# Patient Record
Sex: Female | Born: 1945 | Race: White | Hispanic: No | Smoking: Former smoker
Health system: Southern US, Community
[De-identification: ages and names within clinical notes are randomized; demographics above are authoritative.]

## PROBLEM LIST (undated history)

## (undated) DIAGNOSIS — I2510 Atherosclerotic heart disease of native coronary artery without angina pectoris: Secondary | ICD-10-CM

## (undated) DIAGNOSIS — I2699 Other pulmonary embolism without acute cor pulmonale: Secondary | ICD-10-CM

## (undated) DIAGNOSIS — G43909 Migraine, unspecified, not intractable, without status migrainosus: Secondary | ICD-10-CM

## (undated) DIAGNOSIS — G629 Polyneuropathy, unspecified: Secondary | ICD-10-CM

## (undated) DIAGNOSIS — E785 Hyperlipidemia, unspecified: Secondary | ICD-10-CM

## (undated) DIAGNOSIS — I10 Essential (primary) hypertension: Secondary | ICD-10-CM

## (undated) DIAGNOSIS — H269 Unspecified cataract: Secondary | ICD-10-CM

## (undated) DIAGNOSIS — M069 Rheumatoid arthritis, unspecified: Secondary | ICD-10-CM

## (undated) DIAGNOSIS — K659 Peritonitis, unspecified: Secondary | ICD-10-CM

## (undated) HISTORY — DX: Rheumatoid arthritis, unspecified: M06.9

## (undated) HISTORY — PX: ABDOMINAL EXPLORATION SURGERY: SHX538

## (undated) HISTORY — PX: BACK SURGERY: SHX140

## (undated) HISTORY — PX: ABDOMINAL HYSTERECTOMY: SHX81

---

## 2003-05-09 ENCOUNTER — Ambulatory Visit (HOSPITAL_COMMUNITY): Admission: RE | Admit: 2003-05-09 | Discharge: 2003-05-09 | Payer: Self-pay | Admitting: Family Medicine

## 2003-06-29 ENCOUNTER — Inpatient Hospital Stay (HOSPITAL_COMMUNITY): Admission: RE | Admit: 2003-06-29 | Discharge: 2003-07-03 | Payer: Self-pay | Admitting: Specialist

## 2003-08-25 ENCOUNTER — Emergency Department (HOSPITAL_COMMUNITY): Admission: EM | Admit: 2003-08-25 | Discharge: 2003-08-25 | Payer: Self-pay | Admitting: Emergency Medicine

## 2004-06-10 ENCOUNTER — Inpatient Hospital Stay (HOSPITAL_COMMUNITY): Admission: RE | Admit: 2004-06-10 | Discharge: 2004-06-11 | Payer: Self-pay | Admitting: Specialist

## 2005-02-15 ENCOUNTER — Emergency Department (HOSPITAL_COMMUNITY): Admission: EM | Admit: 2005-02-15 | Discharge: 2005-02-15 | Payer: Self-pay | Admitting: Emergency Medicine

## 2007-08-04 ENCOUNTER — Encounter: Admission: RE | Admit: 2007-08-04 | Discharge: 2007-08-04 | Payer: Self-pay | Admitting: Family Medicine

## 2007-09-17 ENCOUNTER — Emergency Department (HOSPITAL_COMMUNITY): Admission: EM | Admit: 2007-09-17 | Discharge: 2007-09-18 | Payer: Self-pay | Admitting: Emergency Medicine

## 2007-09-22 ENCOUNTER — Encounter: Admission: RE | Admit: 2007-09-22 | Discharge: 2007-09-22 | Payer: Self-pay | Admitting: Specialist

## 2008-05-28 ENCOUNTER — Ambulatory Visit (HOSPITAL_COMMUNITY): Admission: RE | Admit: 2008-05-28 | Discharge: 2008-05-28 | Payer: Self-pay | Admitting: Ophthalmology

## 2008-08-01 ENCOUNTER — Ambulatory Visit (HOSPITAL_COMMUNITY): Admission: RE | Admit: 2008-08-01 | Discharge: 2008-08-01 | Payer: Self-pay | Admitting: Ophthalmology

## 2008-08-24 ENCOUNTER — Encounter: Admission: RE | Admit: 2008-08-24 | Discharge: 2008-08-24 | Payer: Self-pay | Admitting: Family Medicine

## 2009-08-22 ENCOUNTER — Encounter: Admission: RE | Admit: 2009-08-22 | Discharge: 2009-08-22 | Payer: Self-pay | Admitting: Family Medicine

## 2009-12-06 IMAGING — MG MM SCREEN MAMMOGRAM BILATERAL
4 series · 4 of 4 positions shown · non-contrast
Comparison: none

DG SCREEN MAMMOGRAM BILATERAL
Bilateral CC and MLO view(s) were taken.

DIGITAL SCREENING MAMMOGRAM WITH CAD:
There are scattered fibroglandular densities.  No masses or malignant type calcifications are 
identified.  Compared with prior studies.

[R CC]
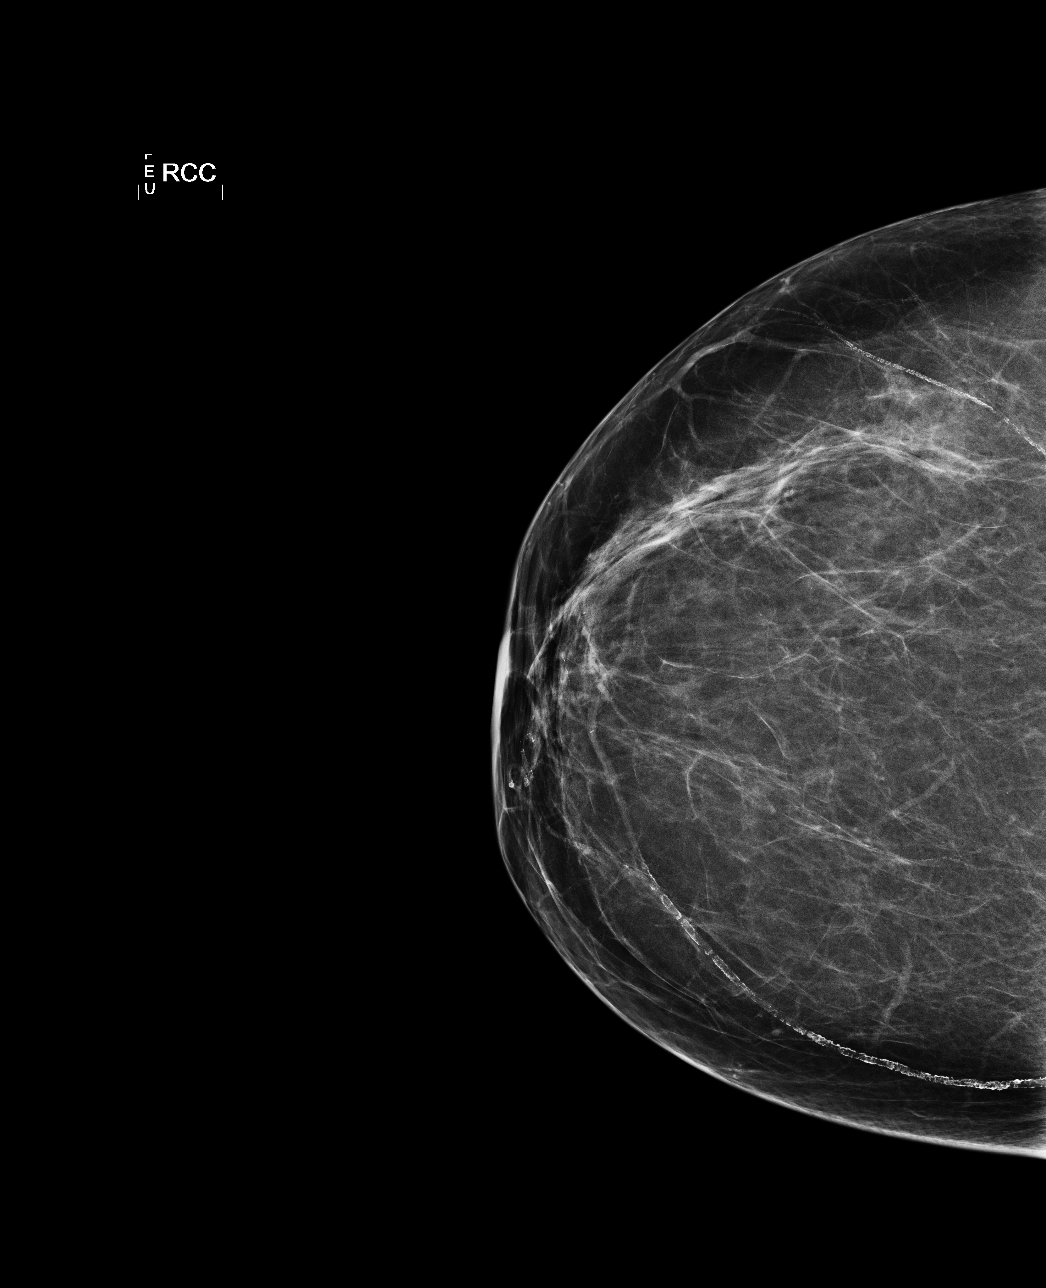

[L CC]
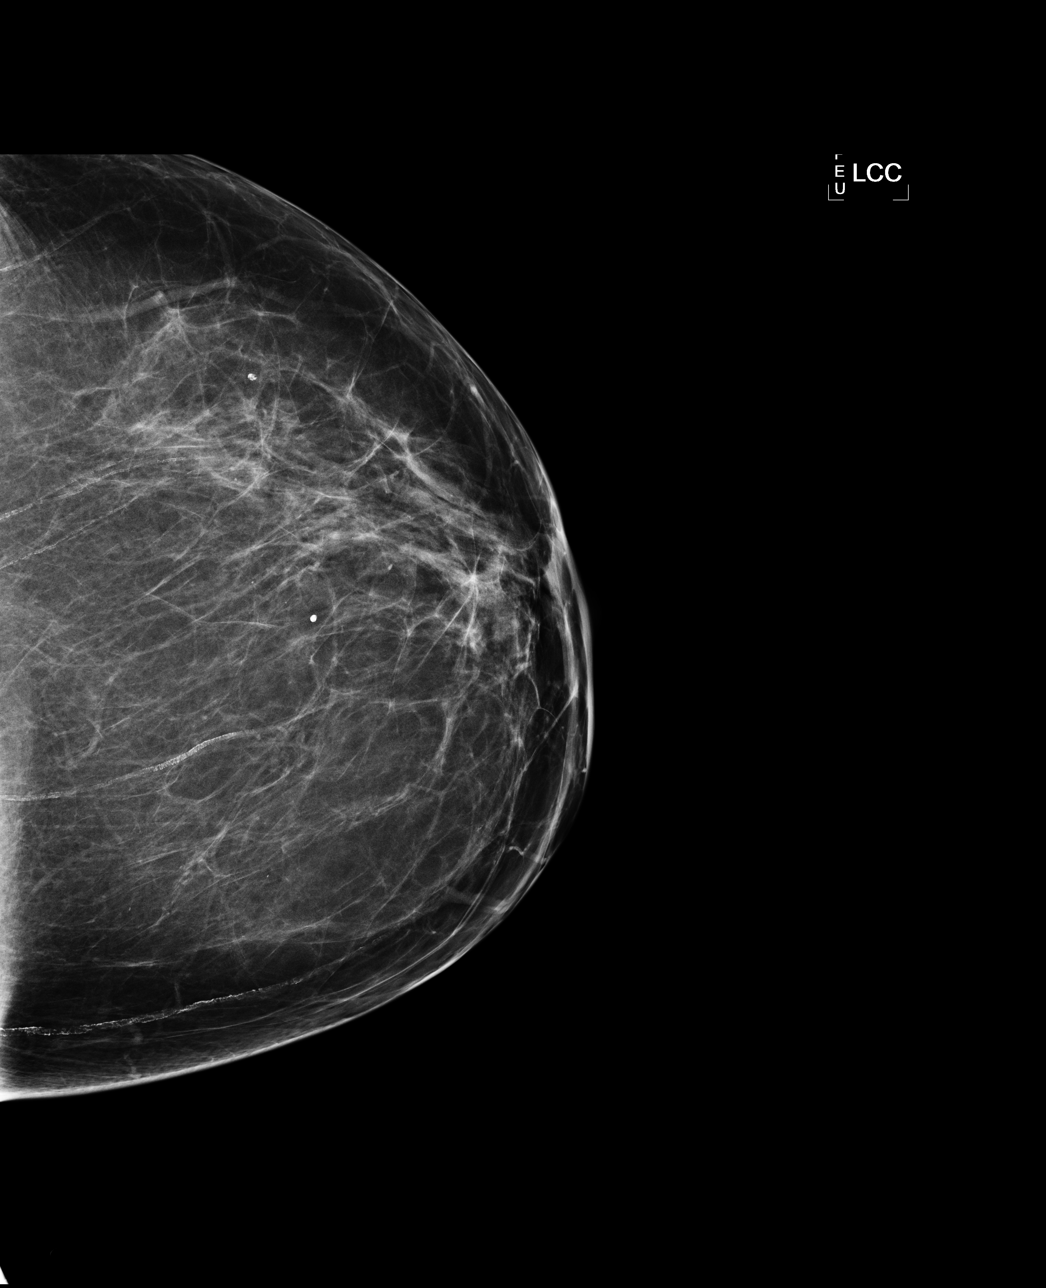

[L MLO]
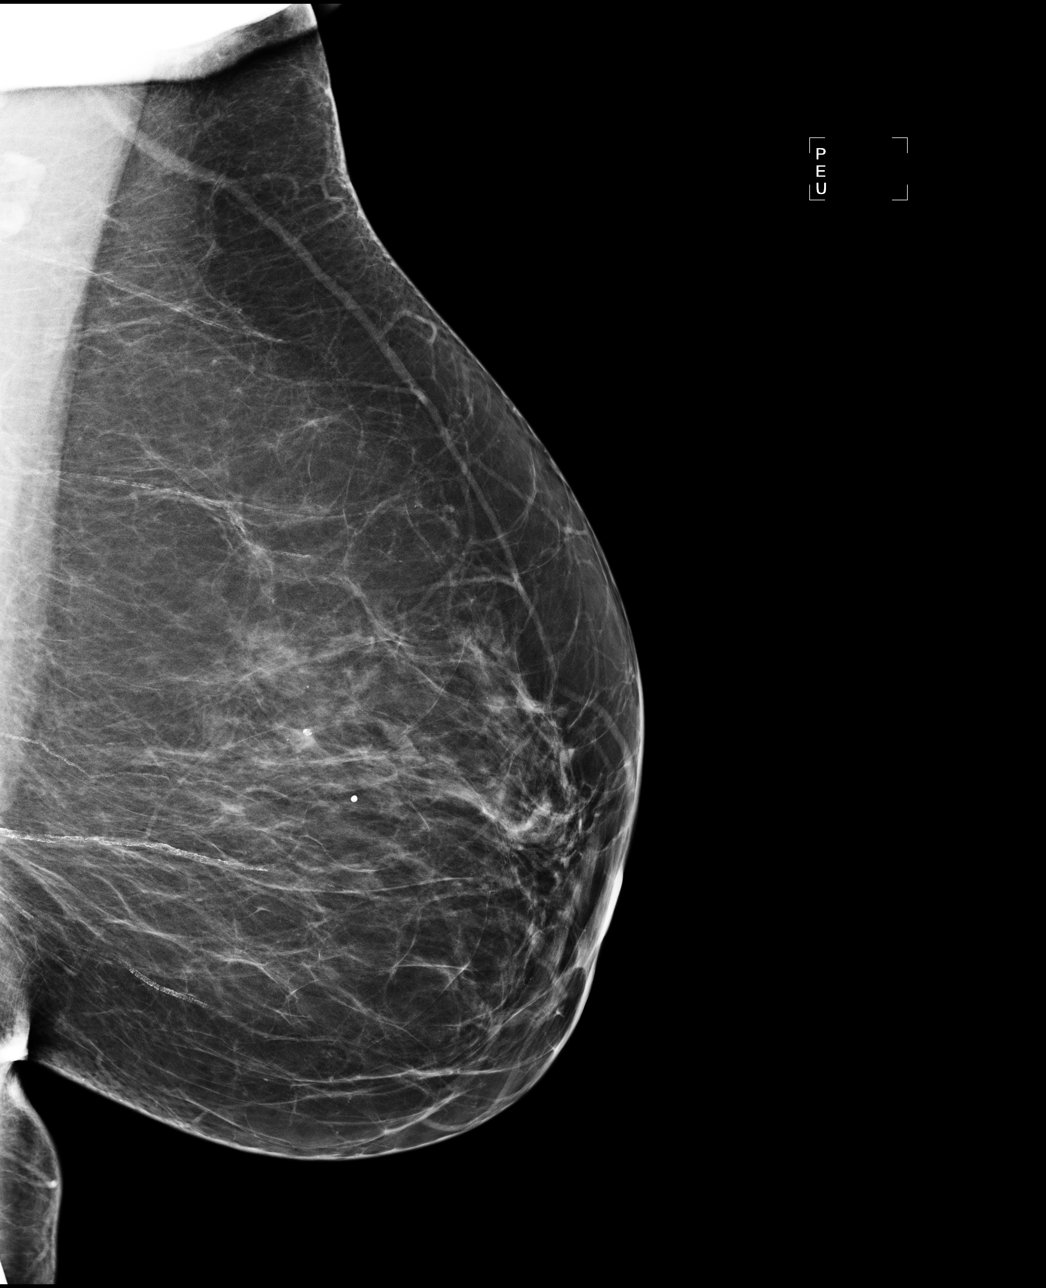

[R MLO]
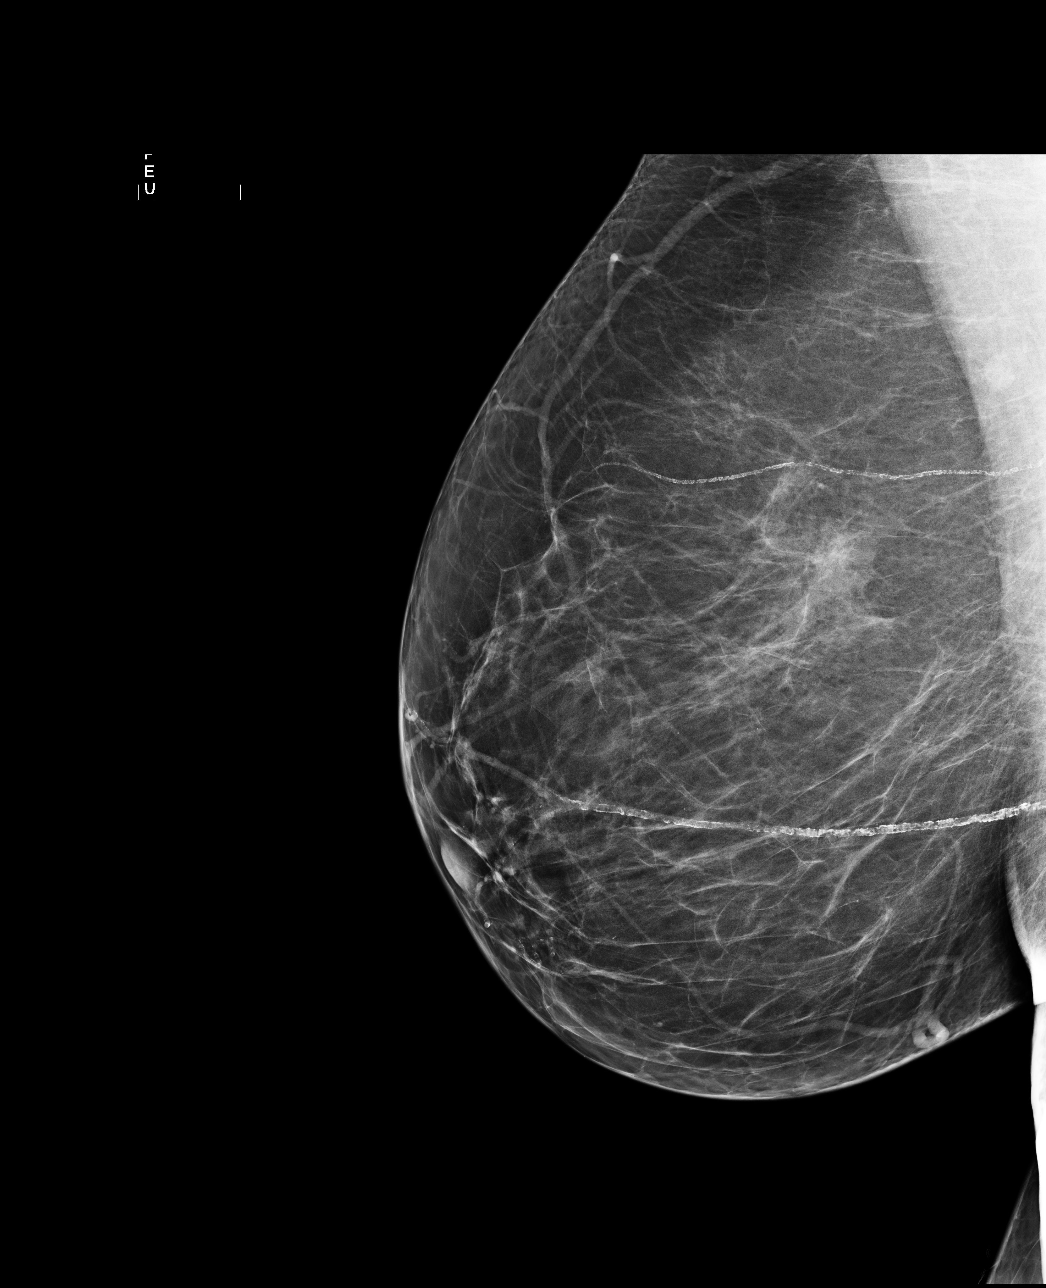

[4 of 4 positions shown; findings below may reference images not displayed]

IMPRESSION: No specific mammographic evidence of malignancy.  Next screening mammogram is recommended in one 
year.

A result letter of this screening mammogram will be mailed directly to the patient.

ASSESSMENT: Negative - BI-RADS 1

Screening mammogram in 1 year.
ANALYZED BY COMPUTER AIDED DETECTION. , THIS PROCEDURE WAS A DIGITAL MAMMOGRAM.

## 2010-09-12 ENCOUNTER — Other Ambulatory Visit: Payer: Self-pay | Admitting: Family Medicine

## 2010-09-12 DIAGNOSIS — Z1231 Encounter for screening mammogram for malignant neoplasm of breast: Secondary | ICD-10-CM

## 2010-10-07 ENCOUNTER — Ambulatory Visit
Admission: RE | Admit: 2010-10-07 | Discharge: 2010-10-07 | Disposition: A | Discharge status: Home / Self Care | Payer: BLUE CROSS/BLUE SHIELD | Source: Ambulatory Visit | Attending: Family Medicine | Admitting: Family Medicine

## 2010-10-07 DIAGNOSIS — Z1231 Encounter for screening mammogram for malignant neoplasm of breast: Secondary | ICD-10-CM

## 2010-10-07 LAB — CBC
HCT: 41.6 % (ref 36.0–46.0)
Hemoglobin: 14.3 g/dL (ref 12.0–15.0)
MCHC: 34.3 g/dL (ref 30.0–36.0)
MCV: 87.7 fL (ref 78.0–100.0)
Platelets: 189 10*3/uL (ref 150–400)
RBC: 4.74 MIL/uL (ref 3.87–5.11)
RDW: 12.7 % (ref 11.5–15.5)
WBC: 6.9 10*3/uL (ref 4.0–10.5)

## 2010-10-07 LAB — URINALYSIS, ROUTINE W REFLEX MICROSCOPIC
Bilirubin Urine: NEGATIVE
Glucose, UA: NEGATIVE mg/dL
Hgb urine dipstick: NEGATIVE
Ketones, ur: NEGATIVE mg/dL
Nitrite: NEGATIVE
Protein, ur: NEGATIVE mg/dL
Specific Gravity, Urine: 1.03 (ref 1.005–1.030)
Urobilinogen, UA: 1 mg/dL (ref 0.0–1.0)
pH: 5.5 (ref 5.0–8.0)

## 2010-10-07 LAB — COMPREHENSIVE METABOLIC PANEL
ALT: 16 U/L (ref 0–35)
AST: 20 U/L (ref 0–37)
Albumin: 3.8 g/dL (ref 3.5–5.2)
Alkaline Phosphatase: 78 U/L (ref 39–117)
BUN: 15 mg/dL (ref 6–23)
CO2: 29 mEq/L (ref 19–32)
Calcium: 9.2 mg/dL (ref 8.4–10.5)
Chloride: 103 mEq/L (ref 96–112)
Creatinine, Ser: 0.8 mg/dL (ref 0.4–1.2)
GFR calc Af Amer: >60 mL/min (ref >60)
GFR calc non Af Amer: >60 mL/min (ref >60)
Glucose, Bld: 164 mg/dL — ABNORMAL HIGH (ref 70–99)
Potassium: 4.3 mEq/L (ref 3.5–5.1)
Sodium: 138 mEq/L (ref 135–145)
Total Bilirubin: 0.9 mg/dL (ref 0.3–1.2)
Total Protein: 7.2 g/dL (ref 6.0–8.3)

## 2010-10-07 LAB — GLUCOSE, CAPILLARY
Glucose-Capillary: 127 mg/dL — ABNORMAL HIGH (ref 70–99)
Glucose-Capillary: 159 mg/dL — ABNORMAL HIGH (ref 70–99)
Glucose-Capillary: 164 mg/dL — ABNORMAL HIGH (ref 70–99)
Glucose-Capillary: 47 mg/dL — ABNORMAL LOW (ref 70–99)

## 2010-11-04 NOTE — Op Note (Signed)
Kathy Robles, Kathy Robles               ACCOUNT NO.:  [id number]   MEDICAL RECORD NO.:  [id number]          PATIENT TYPE:  AMB   LOCATION:  DFTL                         FACILITY:  [facility]   PHYSICIAN:  Lanna Poche, M.D. DATE OF BIRTH:  May 20, 1946   DATE OF PROCEDURE:  08/01/2008  DATE OF DISCHARGE:  08/01/2008                               OPERATIVE REPORT   PREOPERATIVE DIAGNOSIS:  Diabetic retinopathy with tractional  maculopathy, right eye.   POSTOPERATIVE DIAGNOSIS:  Diabetic retinopathy with tractional  maculopathy, right eye.   PROCEDURE:  Pars plana vitrectomy, peeling of internal limiting  membrane, panretinal photocoagulation, air-fluid exchange, and subtenon  Kenalog injection, right eye.   SURGEON:  Lanna Poche, M.D.   ANESTHESIA:  General endotracheal.   ESTIMATED BLOOD LOSS:  Less than 1 mL.   COMPLICATIONS:  None.   OPERATIVE NOTE:  The patient was taken to the operating room.  After  induction of general anesthesia, the right eye was prepped and draped in  the usual fashion.  The lid speculum was introduced and conjunctival  peritomy was developed at the limbus temporally and superonasally.  Hemostasis was obtained with laser cautery.  The sclerotomies were  fashioned 3.75 mm posterior to the limbus at 1:30, 10:30, and 7:30.  The  superior sclerotomies were plugged and a 4-mm infusion cannula was  secured to the 7:30 sclerotomy with a temporary sutures of 7-0 Vicryl.  The tip was visually inspected and found to be in good position.  The  Landers ring was secured to the globe with 7-0 Vicryl sutures at 3 and  9.  The plugs were removed and 30-degree prismatic lens was applied on  the surface of the eye.  A central core followed by peripheral  vitrectomy was performed.  There was no posterior hyaloid separation.  The suction tip of the vitrector was used to engage the posterior  hyaloid nasal to the optic nerve and then the posterior hyaloid was  peeled from  the surface of the retina.  The peripheral vitrectomy was  then completed 360 degrees.  Magnifying flat lens was then substituted  on surface of the cornea.  On the inspection of the macula, there was a  silicone tip catheter to confirm the absence of the coracoid vitreous.  The cystic fovea was examined and a glistening membrane around the fovea  was seen.  The internal limiting membrane was then incised with the MVR  blade approximately 1 disc diameter superiorly and slightly temporal to  fixation.  The edges of the membrane were then elevated with a Rice  pick.  The membrane was then peeled off the macula in two separate  pieces.  No full thickness retinal breaks or tear was appreciated over  the fovea after this procedure.  The instruments were removed from the  eyes and holes were plugged.  The Landers lens ring removed.  Inspection  with the indirect ophthalmoscope of scleral depression revealed there to  be no peripheral retinal breaks or tears.  An indirect laser was then  used to perform light panretinal photocoagulation 360 degrees back  to  the equator.  The air-fluid exchange was then performed with indirect  ophthalmoscope and silicone tip catheter.  The superior sclerotomy was  then closed with 7-0 Vicryl.  The infusion cannula was removed and  preplaced sutures secured.  The subtenon space was injected  inferotemporally with 40 mg of Kenalog.  The conjunctiva was then drawn  and reapproximated with interrupted running sutures of 6-0 plain gut.  The pressure was checked with a Barraquer tonometer and found to lay at  21.  The subconjunctival space was irrigated with 0.75% Marcaine,  followed by subconjunctival injection of 100  mg of ceftazidime and 10 mg of Decadron.  The lid speculum was then  removed and mixed antibiotic ointment was applied to the surface of the  eye.  Eye patch and shield was then placed over the patient's right eye  and the patient left the operating  room in stable condition.           ______________________________  Lanna Poche, M.D.     JTH/MEDQ  D:  08/01/2008  T:  08/01/2008  Job:  [id number]

## 2010-11-07 NOTE — Op Note (Signed)
Kathy Robles, Kathy Robles               ACCOUNT NO.:  [id number]   MEDICAL RECORD NO.:  [id number]          PATIENT TYPE:  AMB   LOCATION:  DAY                          FACILITY:  [facility]   PHYSICIAN:  Jene Every, M.D.    DATE OF BIRTH:  23-May-1946   DATE OF PROCEDURE:  06/09/2004  DATE OF DISCHARGE:                                 OPERATIVE REPORT   PREOPERATIVE DIAGNOSES:  Spinal stenosis at L5-S1.   POSTOPERATIVE DIAGNOSES:  Spinal stenosis at L5-S1.   PROCEDURE:  Lumbar decompression, central laminectomy of L5-S1, lateral  recess decompression with foraminotomies of S1 and L5.   ANESTHESIA:  General.   ASSISTANT:  Georges Lynch. Gioffre, M.D.   BRIEF HISTORY:  This is a 65 year old who was having right lower extremity  radiculopathy,  S1 nerve root distribution refractory to conservative  treatment.  MRI indicating compression partly relieved by selective nerve  root block. The patient also had lateral recess stenosis in the left.  Operative intervention was indicated for decompression at L5-S1 to alleviate  the S1 radiculopathy. The risks and benefits were discussed including  bleeding, infection, injury to vascular structures, CSF leakage, epidural  fibrosis, adjacent segment disease and need for fusion in the future, etc.   TECHNIQUE:  The patient in supine position and after the induction of  adequate general anesthesia and 1 g of Kefzol, she was placed prone on the  [place] frame, all bony prominences well padded.  The lumbar region was  prepped and draped in the usual sterile fashion.  Two 18 gauge spinal  needles utilized to localize the 5-1 interspace confirmed with x-ray. An  incision was made above the spinous process of 5 and below the spinous  process of S1.  The previous surgical scar was excised.  The subcutaneous  tissue was dissected, electrocautery was utilized to achieve hemostasis. The  spinous process of 5 was identified as was S1.  The paraspinous muscles were  elevated from the lamina of L5 and S1. A McCullough retractor was placed.  Confirmatory radiograph obtained with a Kocher on the spinous process of 5.  Due to the relatively small intralaminar space on the right and the  increased lumbosacral angle, felt that a central decompression was  appropriate. Removed the spinous processes of 5 and S1 with a Leksell  rongeur.  I then completed laminectomy of 5 utilizing a 2 and a 3 mm  Kerrison cephalad decompressing cephalad. I gently mobilized the thecal sac  of the lamina of 5.  The operating microscope was then draped and brought  onto the surgical field. I then detached the ligamentum flavum from the  cephalad edge of S1 utilizing a straight curette.  Hemilaminotomy  bilaterally of S1 was performed.  Utilizing a 2 and a 3 mm Kerrison, I  performed an S1 foraminotomy __________ bilaterally right greater than left.  The neural element was well protected. I then decompressed the lateral  recess which was found significantly stenotic with a 2 mm Kerrison to the  medial border of the pedicle. Foraminotomies at 5 were performed as well.  Following the  decompression of the S1 nerve root as well as 5 root found to  be widely decompressed. A hockey stick probe placed in the S1 and 5 foramen  and found them to be widely patent. There was no evidence of CSF leakage.  There was no disk herniation. Bipolar electrocautery was utilized to achieve  hemostasis.  Bone wax was placed on the cancellous surfaces. The wound was  copiously irrigated with antibiotic irrigation.  Again inspection revealed  no evidence of CSF leakage or active bleeding, __________ decompression  noted.  McCullough retractor was removed, paraspinal muscle was inspected  with no evidence of active bleeding.  The #1 Vicryl interrupted figure-of-  eight suture was utilized to close the dorsolumbar fascia.  2-0 Vicryl  simple sutures in the subcutaneous tissue and the skin was reapproximated   with staples. The wound was dressed sterilely. She was placed supine on the  hospital bed, extubated without difficulty and transported to the recovery  room in satisfactory condition.   The patient tolerated the procedure well with no complications.  Blood loss  was minimal.     Trey Paula   JB/MEDQ  D:  06/09/2004  T:  06/09/2004  Job:  [id number]

## 2010-11-07 NOTE — H&P (Signed)
Kathy Robles, Kathy Robles                         ACCOUNT NO.:  [id number]   MEDICAL RECORD NO.:  [id number]                   PATIENT TYPE:  INP   LOCATION:  NA                                   FACILITY:  [facility]   PHYSICIAN:  Jene Every, M.D.                 DATE OF BIRTH:  [birth year]   DATE OF ADMISSION:  06/29/2003  DATE OF DISCHARGE:                                HISTORY & PHYSICAL   CHIEF COMPLAINT:  Right lower extremity radiculopathy.   HISTORY:  Mr. Critzer is a 65 year old female who is a long-standing patient  of Dr. Shelle Iron.  She has noted several year history of low back pain with pain  down bilateral lower extremities.  The patient has undergone multiple  conservative treatments including epidural steroid injections, activity  modifications, physical therapy.  However, has noted progression of her  symptoms with numbness and tingling down the right lower extremity with  quadriceps weakness noted.  MRI films show significant spinal stenosis at L2-  3, L3-4, and L4-5.  Due to the fact that patient has failed conservative  treatment and her pain pattern is disabling, it is felt that she would  benefit from decompression at the above stated levels.  The risks and  benefits of this surgery were discussed with the patient and she wishes to  proceed.   PAST MEDICAL HISTORY:  1. Non-insulin-dependent diabetes.  2. Gastroesophageal reflux disease.  3. History of pulmonary embolism in 1975.   CURRENT MEDICATIONS:  Amaryl 4 mg one p.o. daily.   ALLERGIES:  COUMADIN which causes a rash.   PAST SURGICAL HISTORY:  1. Cesarean section x2.  2. Carpal tunnel release on the left.  3. Bilateral foot surgery.  4. Right thumb and left thumb replacements.  5. Hysterectomy.   SOCIAL HISTORY:  The patient is divorced.  She stopped smoking in November.  She does drink occasional alcohol.  Family will be caregivers following  surgery.  Family physician is Dr. Holley Bouche.   FAMILY  HISTORY:  Mother and father both had history of diabetes and coronary  artery disease.  Mother also had a history of CVA.   REVIEW OF SYSTEMS:  GENERAL:  The patient denies any fevers, chills, night  sweats, or bleeding tendencies.  CNS:  No blurred or double vision, seizure,  headache, or paralysis.  RESPIRATORY:  No shortness of breath, productive  cough, or hemoptysis.  CARDIOVASCULAR:  No chest pain, angina, orthopnea.  GENITOURINARY:  No dysuria, hematuria, discharge.  GASTROINTESTINAL:  No  nausea, vomiting, diarrhea, constipation, melena, bloody stools.  MUSCULOSKELETAL:  As pertinent in HPI.   PHYSICAL EXAMINATION:  VITAL SIGNS:  Pulse 70, respiratory 16, blood  pressure 130/76.  GENERAL:  This is a well-developed, well-nourished 65 year old female in no  acute distress.  HEENT:  Atraumatic, normocephalic.  Pupils are equal, round, and reactive to  light.  EOMs intact.  NECK:  Supple.  No lymphadenopathy.  CHEST:  Clear to auscultation bilaterally.  No rhonchi, wheezes, or rales.  BREAST:  Not examined.  Not pertinent to HPI.  GENITOURINARY:  Not examined.  Not pertinent to HPI.  HEART:  Regular rate and rhythm without murmurs, rubs, or gallops.  ABDOMEN:  Soft, nontender, nondistended.  Bowel sounds x4.  SKIN:  No rashes or lesions are noted.  EXTREMITIES:  The patient has positive straight leg raise on the right with  decreased quadriceps strength.  Sensation is intact.  No Babinski's or  clonus is noted.   IMPRESSION:  1. Spinal stenosis.  2. Non-insulin-dependent diabetes mellitus.  3. Gastroesophageal reflux disease.  4. Remote pulmonary embolism.   PLAN:  The patient will be admitted to [facility] and undergo  lumbar decompression at L3-4, L4-5 with possibly L2-3 and L5-S1.     Roma Schanz, P.A.                   Jene Every, M.D.    CS/MEDQ  D:  06/28/2003  T:  06/28/2003  Job:  [id number]

## 2010-11-07 NOTE — Discharge Summary (Signed)
Kathy Robles, Kathy Robles                         ACCOUNT NO.:  [id number]   MEDICAL RECORD NO.:  [id number]                   PATIENT TYPE:  INP   LOCATION:  0442                                 [facility]ACILITY[facility]  [facility]   PHYSICIAN:  Jene Every, M.D.                 DATE OF BIRTH:  1946-05-26   DATE OF ADMISSION:  06/29/2003  DATE OF DISCHARGE:  07/03/2003                                 DISCHARGE SUMMARY   ADMISSION DIAGNOSES:  1. Spinal stenosis.  2. Noninsulin-dependent diabetes.  3. Gastroesophageal reflux disease.  4. Remote pulmonary embolism.   DISCHARGE DIAGNOSES:  1. Status post decompression L2-3, L3-4, and L4-5.  2. Noninsulin-dependent diabetes.  3. Gastroesophageal reflux disease.  4. Remote pulmonary embolism.   PROCEDURE:  The patient was taken to the OR on January 7 and underwent  central decompression at the L2-3, L3-4, and L4-5 level.  Surgeon Dr.  Jene Every, assistant Dr. Worthy Rancher.  Anesthesia general.  Estimated  blood loss 100 mL.   CONSULTS:  1. PT/OT.  2. Internal medicine.   LABORATORY VALUES:  Preoperative labs show white blood cell count of 9.4,  hemoglobin 12.0, hematocrit 34.1.  Routine CBC's were followed throughout  the hospital course.  The patient's white blood cell count remained normal.  Slight drop in hemoglobin to the level of 10.7, hematocrit 30.5.  Routine  chemistries done preoperatively showed sodium 135, potassium 3.9, glucose  387.  Routine chemistries were followed throughout the hospital course.  The  patient did have a slight drop in her sodium to 134 and normal potassium at  3.5, decrease in her sugars to a level of 199 at time of discharge.  Routine  liver functions were obtained which were normal saline.  Hemoglobin A1c was  11.  A lipid profile was also obtained and showed a cholesterol of 20,  triglycerides 191, LDL 119.  Urinalysis done preoperatively was clear;  however, showed greater than 1000 mg/dl of glucose with few  bacteria and  yeast noted.  Repeat urinalysis showed 500 mg/dl of glucose with trace  protein.  EKG done preoperatively showed sinus bradycardia, otherwise  normal.  Chest x-ray showed no evidence of active disease.   HOSPITAL COURSE:  The patient was taken to the OR and underwent the above-  stated procedure without difficulty.  She was then transferred to the PACU  and then to the orthopedic floor for continued postoperative care.  Internal  medicine followed along with [place] in managing the medical issues that arose  with her diabetes.  She was placed on the [place] preoperatively due to  her high blood sugar levels.  Postoperative day #1, the patient was doing  well.  She had a slight temperature of 101.3.  Hemovac was removed without  difficulty.  The patient was out of bed with physical therapy.  Routine  postoperative measures were carried out in regard to DVT prophylaxis.  Routine labs were performed and followed with the hospitalist.  The patient  continued to advance well.  Pain was well-controlled on p.o. analgesics.  Incentive spirometer was encouraged.  The patient was voiding and having  bowel movements without difficulty.  Postoperative day #4, it was felt the  patient was doing well, got better control over her glucose level, and it  felt she could be discharged home.   DISPOSITION:  The patient was discharged home with any home health needs  met.   DIET:  Diabetic protocol.   FOLLOW UP:  She is to follow up with Dr. Shelle Iron in 10-14 days as well as  continued follow up care with her medical physician in regard to her  diabetes.   WOUND CARE:  She is to have daily dressing changes, okay to shower.   ACTIVITY:  Walk as tolerated.  No bending, twisting, lifting, or stooping.   DISCHARGE MEDICATIONS:  Percocet, Robaxin, vitamin C as well as home  medications.   CONDITION ON DISCHARGE:  Stable.   FINAL DIAGNOSIS:  Status post multilevel decompression.      Roma Schanz, P.A.                   Jene Every, M.D.    CS/MEDQ  D:  07/18/2003  T:  07/18/2003  Job:  [id number]

## 2010-11-07 NOTE — Op Note (Signed)
Kathy Robles, Kathy Robles                         ACCOUNT NO.:  [id number]   MEDICAL RECORD NO.:  [id number]                   PATIENT TYPE:  INP   LOCATION:  NA                                   [facility]ACILITY:  [facility]   PHYSICIAN:  Jene Every, M.D.                 DATE OF BIRTH:  25-Feb-1946   DATE OF PROCEDURE:  06/29/2003  DATE OF DISCHARGE:                                 OPERATIVE REPORT   PREOPERATIVE DIAGNOSIS:  Spinal stenosis, L2-3, L3-4 and L4-5.   POSTOPERATIVE DIAGNOSIS:  Spinal stenosis, L2-3, L3-4 and L4-5.   OPERATION/PROCEDURE:  1. Central decompression L2-3, L3-4 and L4-5.  2. Bilateral foraminotomies with decompression of the L2, L3, L4, and L5     nerve roots bilaterally.   SURGEON:  Jene Every, M.D.   ANESTHESIA:  General.   ASSISTANT:  Georges Lynch. Gioffre, M.D.   BRIEF HISTORY AND INDICATIONS:  This is a 64 year old with severe spinal  stenosis on multiple levels, refractory to conservative treatment including  corticosteroid injections, no instability on radiographs preoperatively,  mild scoliosis.  Operative intervention was indicated for decompression of  the stenotic segments. The risks and benefits were discussed including  bleeding, infection, anesthetic risks, CSF leakage, epidural fibrosis,  adjacent segment disease, need for fusion in the future, etc.   DESCRIPTION OF PROCEDURE:  With the patient in the supine position and after  induction of adequate general anesthesia and 1 g of Kefzol, the patient was  placed prone on the [place] frame.  All bony prominences were well padded.  The lumbar region was prepped and draped in the usual sterile fashion.  Incision was made from the spinous process of L2 to L5.  Subcutaneous tissue  was dissected using electrocautery to achieve hemostasis.  Dorsal lumbar  fascia was identified and divided in line with the skin incision.  Paraspinous muscles elevated from the lamina of L2, 3, 4, and 5 bilaterally.  Kochers  were placed in the spinous processes of L2 and L3.  Spinous  processes of L2, L3, and L4, and partially of L5 were removed with a Leksell  rongeur.  A 2 mm Kerrison was utilized to begin a decompression at L5.  From  the cephalad edge of L5 bilaterally, detachment of insertion of the  ligamentum flavum performed and laminotomies bilaterally.  Then we  decompressed centrally at the L4 lamina and removed the central neural arch  at L4.  The decompression initiated laterally and then moved centrally.  Partial medial hemifacetectomies were performed bilaterally with a 2 and 3  mm Kerrisons, preserving 50% of the facets bilaterally.  Neural arch of 3  was removed as well and the decompression of the partial medial  hemifacetectomies of L3-4 and L2-3 were performed as well.  I obtained the  top edge of the neural arch of L2.  We were above the ligamentum flavum here  and that is  about to go back.  Partial medial hemifacetectomies were  performed at L2-3 as well.  The operating microscope was draped, brought  into the surgical field and with decompressed lateral recess bilaterally and  performed foraminotomies at L2, L3, L4, and L5 bilaterally.  A severe  radicular stenosis was noted bigger on the left at L4-5 and L3-4.  Bipolar  electrocautery was utilized to achieve strict hemostasis.  Disks were  palpated at each level and there was no evidence of disk herniation.  Hockey  stick probe placed in the foramen of L4-5, L3 and L2 bilaterally and found  to be widely patent following decompression.  Bone wax was placed on the  cancellous surfaces to control bleeding.  The wound was copiously irrigated  with antibiotic irrigation throughout.  The dura was pulsatile, well  decompressed and no evidence of bile leak or active bleeding.  We placed  thiamine-soaked Gelfoam in the laminotomy defects.  I removed the McCullough  retractor from the paraspinous muscles __________ completed.  We placed a  Hemovac  and brought it out through a lateral stab wound in the skin.  Dorsal  lumbar fascia was reapproximated with #1 Vicryl interrupted figure-of-eight  sutures, subcutaneous tissue reapproximated with 2-0 Vicryl interrupted  simple sutures.  Skin reapproximated with staples.  Steri-Strips, sterile  dressing applied.  The patient was placed supine on the hospital bed,  extubated without difficulty and transported to the recovery room in stable  condition.  The patient tolerated the procedure well.  There were no  complications.  Estimated blood loss 100 mL.                                               Jene Every, M.D.    Cordelia Pen  D:  06/29/2003  T:  06/29/2003  Job:  [id number]

## 2011-03-16 LAB — POCT I-STAT, CHEM 8
Chloride: 105
Glucose, Bld: 190 — ABNORMAL HIGH
HCT: 40
Hemoglobin: 13.6
Potassium: 3.9
Sodium: 139

## 2011-03-27 LAB — COMPREHENSIVE METABOLIC PANEL
ALT: 17 U/L (ref 0–35)
Albumin: 3.9 g/dL (ref 3.5–5.2)
Alkaline Phosphatase: 82 U/L (ref 39–117)
Chloride: 105 mEq/L (ref 96–112)
Glucose, Bld: 65 mg/dL — ABNORMAL LOW (ref 70–99)
Potassium: 3.7 mEq/L (ref 3.5–5.1)
Sodium: 140 mEq/L (ref 135–145)
Total Bilirubin: 0.7 mg/dL (ref 0.3–1.2)
Total Protein: 6.8 g/dL (ref 6.0–8.3)

## 2011-03-27 LAB — URINALYSIS, ROUTINE W REFLEX MICROSCOPIC
Glucose, UA: NEGATIVE mg/dL
Hgb urine dipstick: NEGATIVE
Ketones, ur: 40 mg/dL — AB
Protein, ur: NEGATIVE mg/dL

## 2011-03-27 LAB — CBC
Hemoglobin: 14.1 g/dL (ref 12.0–15.0)
RDW: 13.3 % (ref 11.5–15.5)
WBC: 8.4 10*3/uL (ref 4.0–10.5)

## 2011-09-02 ENCOUNTER — Other Ambulatory Visit: Payer: Self-pay | Admitting: Family Medicine

## 2011-09-02 DIAGNOSIS — Z1231 Encounter for screening mammogram for malignant neoplasm of breast: Secondary | ICD-10-CM

## 2011-10-20 ENCOUNTER — Ambulatory Visit
Admission: RE | Admit: 2011-10-20 | Discharge: 2011-10-20 | Disposition: A | Discharge status: Home / Self Care | Payer: BC Managed Care – PPO | Source: Ambulatory Visit | Attending: Family Medicine | Admitting: Family Medicine

## 2011-10-20 DIAGNOSIS — Z1231 Encounter for screening mammogram for malignant neoplasm of breast: Secondary | ICD-10-CM

## 2012-01-13 ENCOUNTER — Encounter (HOSPITAL_COMMUNITY): Payer: Self-pay | Admitting: *Deleted

## 2012-01-13 ENCOUNTER — Emergency Department (HOSPITAL_COMMUNITY)
Admission: EM | Admit: 2012-01-13 | Discharge: 2012-01-13 | Disposition: A | Discharge status: Home / Self Care | Payer: BC Managed Care – PPO | Attending: Emergency Medicine | Admitting: Emergency Medicine

## 2012-01-13 ENCOUNTER — Emergency Department (HOSPITAL_COMMUNITY): Payer: BC Managed Care – PPO

## 2012-01-13 DIAGNOSIS — R51 Headache: Secondary | ICD-10-CM

## 2012-01-13 DIAGNOSIS — I10 Essential (primary) hypertension: Secondary | ICD-10-CM | POA: Insufficient documentation

## 2012-01-13 DIAGNOSIS — H538 Other visual disturbances: Secondary | ICD-10-CM | POA: Insufficient documentation

## 2012-01-13 DIAGNOSIS — E119 Type 2 diabetes mellitus without complications: Secondary | ICD-10-CM | POA: Insufficient documentation

## 2012-01-13 DIAGNOSIS — R42 Dizziness and giddiness: Secondary | ICD-10-CM | POA: Insufficient documentation

## 2012-01-13 DIAGNOSIS — Z794 Long term (current) use of insulin: Secondary | ICD-10-CM | POA: Insufficient documentation

## 2012-01-13 DIAGNOSIS — R112 Nausea with vomiting, unspecified: Secondary | ICD-10-CM | POA: Insufficient documentation

## 2012-01-13 HISTORY — DX: Unspecified cataract: H26.9

## 2012-01-13 HISTORY — DX: Hyperlipidemia, unspecified: E78.5

## 2012-01-13 HISTORY — DX: Other pulmonary embolism without acute cor pulmonale: I26.99

## 2012-01-13 HISTORY — DX: Atherosclerotic heart disease of native coronary artery without angina pectoris: I25.10

## 2012-01-13 HISTORY — DX: Essential (primary) hypertension: I10

## 2012-01-13 HISTORY — DX: Polyneuropathy, unspecified: G62.9

## 2012-01-13 HISTORY — DX: Peritonitis, unspecified: K65.9

## 2012-01-13 HISTORY — DX: Migraine, unspecified, not intractable, without status migrainosus: G43.909

## 2012-01-13 MED ORDER — MECLIZINE HCL 50 MG PO TABS: 25.0000 mg | ORAL_TABLET | Freq: Three times a day (TID) | ORAL | Status: AC | PRN

## 2012-01-13 NOTE — ED Provider Notes (Signed)
Medical screening examination/treatment/procedure(s) were conducted as a shared visit with non-physician practitioner(s) and myself.  I personally evaluated the patient during the encounter  Peripheral vertigo symptoms. Sent by PCP for CT due to "unequal pupils" Pt had prior L cataract removal/lens transplant. No concern for significant intracranial process.   Charles B. Bernette Mayers, MD 01/13/12 1421

## 2012-01-13 NOTE — ED Notes (Signed)
N/v, rt. Pupil non-reactive but pupils equal. Lt. Great than rt. Lt. Briskly reacting. pcp states, "abnormal." saw pcp b/c still filling n/v from last evening. Dizziness, generalized headache (2/10).

## 2012-01-13 NOTE — ED Provider Notes (Signed)
History     CSN: [id number]  Arrival date & time 01/13/12  1225   First MD Initiated Contact with Patient 01/13/12 1227      Chief Complaint  Patient presents with  . Blurred Vision  . Nausea  . Emesis  . Migraine    (Consider location/radiation/quality/duration/timing/severity/associated sxs/prior treatment) HPI Comments: 66 y/o female presents with nausea, vomiting, dizziness and headache since last night. Symptoms began after eating dinner just resting in her home. Dizziness comes when she stands up, has to get herself steady, and subsides. Nausea and vomiting subsided last night. She went to her PCP this morning who sent her to ED for CT scan of her head. Denies any visual changes out of her normal, eye pain, lightheadedness, syncope, chest pain, sob. Had an episode of vertigo from inner ear infection "years ago" and this feels similar. Her headache is located towards the front of her head. She has not tried any palliative factors. States the headache "is not horrible". Has a history of bilateral cataract surgery.  The history is provided by the patient.    Past Medical History  Diagnosis Date  . Diabetes mellitus   . Migraine   . Hypertension   . Cataract   . Coronary artery disease   . Hyperlipemia   . Neuropathy   . Pulmonary embolus   . Peritonitis     Past Surgical History  Procedure Date  . Abdominal hysterectomy   . Cesarean section     No family history on file.  History  Substance Use Topics  . Smoking status: Former Games developer  . Smokeless tobacco: Not on file  . Alcohol Use: Yes    OB History    Grav Para Term Preterm Abortions TAB SAB Ect Mult Living                  Review of Systems  Constitutional: Negative for fatigue.  HENT: Negative for ear pain, sinus pressure and tinnitus.   Eyes: Negative for pain and visual disturbance.  Respiratory: Negative for shortness of breath.   Cardiovascular: Negative for chest pain.  Gastrointestinal:  Positive for nausea and vomiting. Negative for abdominal pain.  Neurological: Positive for dizziness and headaches. Negative for syncope, weakness and light-headedness.    Allergies  Coumadin and Ramipril  Home Medications   Current Outpatient Rx  Name Route Sig Dispense Refill  . ASPIRIN EC 81 MG PO TBEC Oral Take 81 mg by mouth daily.    Marland Kitchen HYDROCODONE-ACETAMINOPHEN 5-500 MG PO TABS Oral Take 1 tablet by mouth at bedtime as needed. Pain    . INSULIN ASPART 100 UNIT/ML [place] SOLN Subcutaneous Inject 40 Units into the skin 2 (two) times daily.    Marland Kitchen LOSARTAN POTASSIUM 100 MG PO TABS Oral Take 100 mg by mouth daily.    Marland Kitchen NORTRIPTYLINE HCL 10 MG PO CAPS Oral Take 10 mg by mouth at bedtime.    Marland Kitchen PREGABALIN 75 MG PO CAPS Oral Take 75 mg by mouth 2 (two) times daily.    Marland Kitchen SIMVASTATIN 40 MG PO TABS Oral Take 40 mg by mouth every evening.      BP 174/95  Pulse 90  Temp 98.2 F (36.8 C) (Oral)  Resp 20  SpO2 97%  Physical Exam  Constitutional: She is oriented to person, place, and time. She appears well-developed and well-nourished. No distress.  HENT:  Head: Normocephalic and atraumatic.  Right Ear: Hearing, tympanic membrane, external ear and ear canal normal.  Left  Ear: Hearing, tympanic membrane, external ear and ear canal normal.  Mouth/Throat: Uvula is midline, oropharynx is clear and moist and mucous membranes are normal.  Eyes: Conjunctivae and EOM are normal. Right pupil is not reactive. Pupils are unequal (L>R).  Neck: Normal range of motion and full passive range of motion without pain. Neck supple. No rigidity.  Cardiovascular: Normal rate, regular rhythm, normal heart sounds and normal pulses.   Pulmonary/Chest: Effort normal and breath sounds normal.  Abdominal: Soft. Normal appearance and bowel sounds are normal. There is no tenderness.  Neurological: She is alert and oriented to person, place, and time. She has normal strength. No cranial nerve deficit or sensory deficit.  She displays a negative Romberg sign. Gait normal.  Skin: Skin is warm.  Psychiatric: She has a normal mood and affect. Her behavior is normal.    ED Course  Procedures (including critical care time)  Labs Reviewed - No data to display No results found.  Date: 01/13/2012  Rate: 85  Rhythm: normal sinus rhythm  QRS Axis: normal  Intervals: normal  ST/T Wave abnormalities: normal  Conduction Disutrbances: RSR' in V1 or V2, right VCD or RVH  Old EKG Reviewed: changes noted    No diagnosis found.    MDM  66 y/o female with 1 day of dizziness upon standing and headache. N/V subsided prior to ED visit. CT scan negative. Orthostats normal. Patient is in NAD and comfortable. Current episode similar to her previous vertigo episode. Patient comfortable being discharged with meclizine and following up with her PCP. Case discussed with Dr. Bernette Mayers who agrees with plan of care.         Trevor Mace, PA-C 01/13/12 1436

## 2012-09-13 ENCOUNTER — Other Ambulatory Visit: Payer: Self-pay

## 2012-09-13 DIAGNOSIS — Z1231 Encounter for screening mammogram for malignant neoplasm of breast: Secondary | ICD-10-CM

## 2012-11-01 ENCOUNTER — Ambulatory Visit
Admission: RE | Admit: 2012-11-01 | Discharge: 2012-11-01 | Disposition: A | Discharge status: Home / Self Care | Payer: PRIVATE HEALTH INSURANCE | Source: Ambulatory Visit

## 2012-11-01 DIAGNOSIS — Z1231 Encounter for screening mammogram for malignant neoplasm of breast: Secondary | ICD-10-CM

## 2013-04-20 ENCOUNTER — Other Ambulatory Visit: Payer: Self-pay | Admitting: *Deleted

## 2013-04-20 DIAGNOSIS — I739 Peripheral vascular disease, unspecified: Secondary | ICD-10-CM

## 2013-04-20 DIAGNOSIS — I70219 Atherosclerosis of native arteries of extremities with intermittent claudication, unspecified extremity: Secondary | ICD-10-CM

## 2013-05-15 ENCOUNTER — Encounter: Payer: Self-pay | Admitting: Vascular Surgery

## 2013-05-16 ENCOUNTER — Ambulatory Visit (HOSPITAL_COMMUNITY)
Admission: RE | Admit: 2013-05-16 | Discharge: 2013-05-16 | Disposition: A | Discharge status: Home / Self Care | Payer: PRIVATE HEALTH INSURANCE | Source: Ambulatory Visit | Attending: Vascular Surgery | Admitting: Vascular Surgery

## 2013-05-16 ENCOUNTER — Encounter: Payer: Self-pay | Admitting: Vascular Surgery

## 2013-05-16 ENCOUNTER — Ambulatory Visit (INDEPENDENT_AMBULATORY_CARE_PROVIDER_SITE_OTHER): Payer: PRIVATE HEALTH INSURANCE | Admitting: Vascular Surgery

## 2013-05-16 VITALS — BP 162/73 | HR 87 | Resp 16 | Ht 61.0 in | Wt 170.0 lb

## 2013-05-16 DIAGNOSIS — I70219 Atherosclerosis of native arteries of extremities with intermittent claudication, unspecified extremity: Secondary | ICD-10-CM | POA: Insufficient documentation

## 2013-05-16 DIAGNOSIS — I739 Peripheral vascular disease, unspecified: Secondary | ICD-10-CM | POA: Insufficient documentation

## 2013-05-16 NOTE — Progress Notes (Signed)
Patient name: Kathy Robles MRN: [id number] DOB: 06-May-1946 Sex: female   Referred by: Talmage Nap  Reason for referral:  Chief Complaint  Patient presents with  . New Evaluation    bilateral leg pain/cramping  referred by Dr Dorisann Frames    HISTORY OF PRESENT ILLNESS: Patient is a very pleasant 67 year old female who reports difficulty with tiredness in both calves with extensive walking. She works at [employer] and that has several times during the day when she is required to have a long distance walker. She reports that she has tiredness in her posterior calves and this is relieved with rest. She also reports some difficulty with her walking with a dancing and with walking on the treadmill. She does have a past extensive history of 2 prior back surgeries and and reports continued difficulty with this as well. She is a pain management program. She does have a remote history of DVT around the time of pregnancies when she was in her late 32s. No other a clotting disorder since that time.  Past Medical History  Diagnosis Date  . Diabetes mellitus   . Migraine   . Hypertension   . Cataract   . Coronary artery disease   . Hyperlipemia   . Neuropathy   . Pulmonary embolus   . Peritonitis     Past Surgical History  Procedure Laterality Date  . Abdominal hysterectomy    . Cesarean section    . Abdominal exploration surgery    . Back surgery      History   Social History  . Marital Status: Divorced    Spouse Name: N/A    Number of Children: N/A  . Years of Education: N/A   Occupational History  . Not on file.   Social History Main Topics  . Smoking status: Former Smoker    Types: Cigarettes    Quit date: 05/16/2001  . Smokeless tobacco: Never Used  . Alcohol Use: Yes  . Drug Use: No  . Sexual Activity: Not on file   Other Topics Concern  . Not on file   Social History Narrative  . No narrative on file    Family History  Problem Relation Age of  Onset  . Diabetes Mother   . Heart disease Mother   . Diabetes Father     Allergies as of 05/16/2013 - Review Complete 05/16/2013  Allergen Reaction Noted  . Coumadin [warfarin sodium] Hives 01/13/2012  . Ramipril Cough 01/13/2012    Current Outpatient Prescriptions on File Prior to Visit  Medication Sig Dispense Refill  . aspirin EC 81 MG tablet Take 81 mg by mouth daily.      Marland Kitchen HYDROcodone-acetaminophen (VICODIN) 5-500 MG per tablet Take 1 tablet by mouth at bedtime as needed. Pain      . insulin aspart (NOVOLOG) 100 UNIT/ML injection Inject 40 Units into the skin 2 (two) times daily.      Marland Kitchen losartan (COZAAR) 100 MG tablet Take 100 mg by mouth daily.      . nortriptyline (PAMELOR) 10 MG capsule Take 10 mg by mouth at bedtime.      . pregabalin (LYRICA) 75 MG capsule Take 75 mg by mouth 2 (two) times daily.      . simvastatin (ZOCOR) 40 MG tablet Take 40 mg by mouth every evening.       No current facility-administered medications on file prior to visit.     REVIEW OF SYSTEMS:  Positives indicated with an "  X"  CARDIOVASCULAR:  [ ]  chest pain   [ ]  chest pressure   [ ]  palpitations   [ ]  orthopnea   [ ]  dyspnea on exertion   [ ]  claudication   [ ]  rest pain   [x ] DVT   [ ]  phlebitis PULMONARY:   [ ]  productive cough   [ ]  asthma   [ ]  wheezing NEUROLOGIC:   [ ]  weakness  [ ]  paresthesias  [ ]  aphasia  [ ]  amaurosis  [ ]  dizziness HEMATOLOGIC:   [ ]  bleeding problems   [ ]  clotting disorders MUSCULOSKELETAL:  [ ]  joint pain   [ ]  joint swelling GASTROINTESTINAL: [ ]   blood in stool  [ ]   hematemesis GENITOURINARY:  [ ]   dysuria  [ ]   hematuria PSYCHIATRIC:  [ ]  history of major depression INTEGUMENTARY:  [ ]  rashes  [ ]  ulcers CONSTITUTIONAL:  [ ]  fever   [ ]  chills  PHYSICAL EXAMINATION:  General: The patient is a well-nourished female, in no acute distress. Vital signs are BP 162/73  Pulse 87  Resp 16  Ht 5\' 1"  (1.549 m)  Wt 170 lb (77.111 kg)  BMI 32.14  kg/m2 Pulmonary: There is a good air exchange bilaterally without wheezing or rales. Abdomen: Soft and non-tender with normal pitch bowel sounds. Musculoskeletal: There are no major deformities.  There is no significant extremity pain. Neurologic: No focal weakness or paresthesias are detected, Skin: There are no ulcer or rashes noted. Psychiatric: The patient has normal affect. Cardiovascular: There is a regular rate and rhythm without significant murmur appreciated. Carotid arteries without bruits bilaterally Pulse status: 2+ radial 2+ femoral 2+ popliteal and 2+ dorsalis pedis pulses bilaterally  VVS Vascular Lab Studies:  Ordered and Independently Reviewed lower surety studies revealed normal ankle arm index and normal triphasic waveforms bilaterally.  Impression and Plan:  No evidence of lower extremity arterial insufficiency. Patient is quite relieved with this discussion. I suspect her most likely cause would be degenerative disc disease. She will continue with her pain management and see [place] on an as-needed basis.    Christalyn Goertz [facility] Office: [phone]

## 2013-10-02 ENCOUNTER — Other Ambulatory Visit: Payer: Self-pay

## 2013-10-02 DIAGNOSIS — Z1231 Encounter for screening mammogram for malignant neoplasm of breast: Secondary | ICD-10-CM

## 2013-11-14 ENCOUNTER — Ambulatory Visit
Admission: RE | Admit: 2013-11-14 | Discharge: 2013-11-14 | Disposition: A | Discharge status: Home / Self Care | Payer: PRIVATE HEALTH INSURANCE | Source: Ambulatory Visit

## 2013-11-14 ENCOUNTER — Encounter (INDEPENDENT_AMBULATORY_CARE_PROVIDER_SITE_OTHER): Payer: Self-pay

## 2013-11-14 DIAGNOSIS — Z1231 Encounter for screening mammogram for malignant neoplasm of breast: Secondary | ICD-10-CM

## 2014-09-22 ENCOUNTER — Emergency Department (HOSPITAL_COMMUNITY): Payer: No Typology Code available for payment source

## 2014-09-22 ENCOUNTER — Encounter (HOSPITAL_COMMUNITY): Payer: Self-pay | Admitting: Adult Health

## 2014-09-22 ENCOUNTER — Emergency Department (HOSPITAL_COMMUNITY)
Admission: EM | Admit: 2014-09-22 | Discharge: 2014-09-22 | Disposition: A | Discharge status: Home / Self Care | Payer: No Typology Code available for payment source | Attending: Emergency Medicine | Admitting: Emergency Medicine

## 2014-09-22 DIAGNOSIS — H269 Unspecified cataract: Secondary | ICD-10-CM | POA: Diagnosis not present

## 2014-09-22 DIAGNOSIS — E119 Type 2 diabetes mellitus without complications: Secondary | ICD-10-CM | POA: Insufficient documentation

## 2014-09-22 DIAGNOSIS — I10 Essential (primary) hypertension: Secondary | ICD-10-CM | POA: Diagnosis not present

## 2014-09-22 DIAGNOSIS — G43909 Migraine, unspecified, not intractable, without status migrainosus: Secondary | ICD-10-CM | POA: Diagnosis not present

## 2014-09-22 DIAGNOSIS — Z7982 Long term (current) use of aspirin: Secondary | ICD-10-CM | POA: Insufficient documentation

## 2014-09-22 DIAGNOSIS — W01198A Fall on same level from slipping, tripping and stumbling with subsequent striking against other object, initial encounter: Secondary | ICD-10-CM | POA: Diagnosis not present

## 2014-09-22 DIAGNOSIS — S29092A Other injury of muscle and tendon of back wall of thorax, initial encounter: Secondary | ICD-10-CM | POA: Diagnosis present

## 2014-09-22 DIAGNOSIS — S20222A Contusion of left back wall of thorax, initial encounter: Secondary | ICD-10-CM | POA: Diagnosis not present

## 2014-09-22 DIAGNOSIS — G629 Polyneuropathy, unspecified: Secondary | ICD-10-CM | POA: Diagnosis not present

## 2014-09-22 DIAGNOSIS — Z86711 Personal history of pulmonary embolism: Secondary | ICD-10-CM | POA: Insufficient documentation

## 2014-09-22 DIAGNOSIS — Y998 Other external cause status: Secondary | ICD-10-CM | POA: Diagnosis not present

## 2014-09-22 DIAGNOSIS — Y9389 Activity, other specified: Secondary | ICD-10-CM | POA: Insufficient documentation

## 2014-09-22 DIAGNOSIS — E785 Hyperlipidemia, unspecified: Secondary | ICD-10-CM | POA: Insufficient documentation

## 2014-09-22 DIAGNOSIS — I2510 Atherosclerotic heart disease of native coronary artery without angina pectoris: Secondary | ICD-10-CM | POA: Diagnosis not present

## 2014-09-22 DIAGNOSIS — Z87891 Personal history of nicotine dependence: Secondary | ICD-10-CM | POA: Insufficient documentation

## 2014-09-22 DIAGNOSIS — Z79899 Other long term (current) drug therapy: Secondary | ICD-10-CM | POA: Insufficient documentation

## 2014-09-22 DIAGNOSIS — Z794 Long term (current) use of insulin: Secondary | ICD-10-CM | POA: Insufficient documentation

## 2014-09-22 DIAGNOSIS — Z8719 Personal history of other diseases of the digestive system: Secondary | ICD-10-CM | POA: Diagnosis not present

## 2014-09-22 DIAGNOSIS — Y92090 Kitchen in other non-institutional residence as the place of occurrence of the external cause: Secondary | ICD-10-CM | POA: Insufficient documentation

## 2014-09-22 NOTE — ED Provider Notes (Signed)
Patient slipped and fell last night injuring mid back, striking it against the countertop. No other injury. Patient alert no distress Glasgow Coma Score 15 ambulatory without difficulty. Declines pain medicine in the emergency department  Doug Sou, MD 09/22/14 2217

## 2014-09-22 NOTE — ED Provider Notes (Signed)
CSN: [id number]     Arrival date & time 09/22/14  2000 History  This chart was scribed for Kathy Pel, PA-C, working with Kathy Sou, MD by Kathy Robles, ED Scribe. The patient was seen in room TR11C/TR11C at 9:54 PM.    Chief Complaint  Patient presents with  . Fall      The history is provided by the patient. No language interpreter was used.    HPI Comments: Kathy Robles is a 69 y.o. female with a medical hx of HTN, DM, CAD, PE, who presents to the Emergency Department complaining of fall onset last night. Pt was trying to get an appliance down from the fridge when she slipped and fell backwards. The pt caught herself with the counter with her left back and her right arm but she didn't completely fall on to  the floor. Pt hit her left thoracic back on the kitchen counter. Pt can normally move her left arm without any issues but when she tries now it is causes pain in her mid left back. Pt notes that the burning sensation is quick and intermittent. Pt denies any heart issues that she has. She denies hitting her head, LOC, SOB, low back pain, hip pain, left shoulder pain, and any other symptoms. Denies generalized or focal weakness. Pt denies being on blood thinners.    Past Medical History  Diagnosis Date  . Diabetes mellitus   . Migraine   . Hypertension   . Cataract   . Coronary artery disease   . Hyperlipemia   . Neuropathy   . Pulmonary embolus   . Peritonitis    Past Surgical History  Procedure Laterality Date  . Abdominal hysterectomy    . Cesarean section    . Abdominal exploration surgery    . Back surgery     Family History  Problem Relation Age of Onset  . Diabetes Mother   . Heart disease Mother   . Diabetes Father    History  Substance Use Topics  . Smoking status: Former Smoker    Types: Cigarettes    Quit date: 05/16/2001  . Smokeless tobacco: Never Used  . Alcohol Use: Yes   OB History    No data available     Review of Systems   Respiratory: Negative for shortness of breath.   Musculoskeletal: Positive for arthralgias. Negative for back pain.  Neurological: Negative for syncope.      Allergies  Coumadin and Ramipril  Home Medications   Prior to Admission medications   Medication Sig Start Date End Date Taking? Authorizing Provider  aspirin EC 81 MG tablet Take 81 mg by mouth daily.    Historical Provider, MD  HYDROcodone-acetaminophen (VICODIN) 5-500 MG per tablet Take 1 tablet by mouth at bedtime as needed. Pain    Historical Provider, MD  insulin aspart (NOVOLOG) 100 UNIT/ML injection Inject 40 Units into the skin 2 (two) times daily.    Historical Provider, MD  losartan (COZAAR) 100 MG tablet Take 100 mg by mouth daily.    Historical Provider, MD  nortriptyline (PAMELOR) 10 MG capsule Take 10 mg by mouth at bedtime.    Historical Provider, MD  pregabalin (LYRICA) 75 MG capsule Take 75 mg by mouth 2 (two) times daily.    Historical Provider, MD  simvastatin (ZOCOR) 40 MG tablet Take 40 mg by mouth every evening.    Historical Provider, MD   BP 156/53 mmHg  Pulse 98  Temp(Src) 99.1 F (37.3 C) (Oral)  Resp 18  SpO2 96%  Physical Exam  Constitutional: She is oriented to person, place, and time. She appears well-developed and well-nourished. No distress.  HENT:  Head: Normocephalic and atraumatic.  Eyes: EOM are normal.  Neck: Neck supple. No tracheal deviation present.  Cardiovascular: Normal rate.   Pulmonary/Chest: Effort normal. No respiratory distress.  Musculoskeletal: Normal range of motion.       Back:  Pt has equal strength to bilateral lower extremities.  Neurosensory function adequate to both legs No clonus on dorsiflextion Skin color is normal. Skin is warm and moist.  I see no step off deformity, no midline bony tenderness.  Pt is able to ambulate.  No crepitus, laceration, effusion, induration, lesions, swelling.   Pedal pulses are symmetrical and palpable bilaterally     tenderness to palpation of thoracic paraspinel muscles   Neurological: She is alert and oriented to person, place, and time.  Skin: Skin is warm and dry.  Psychiatric: She has a normal mood and affect. Her behavior is normal.  Nursing note and vitals reviewed.   ED Course  Procedures (including critical care time) DIAGNOSTIC STUDIES: Oxygen Saturation is 96% on RA, normal by my interpretation.    COORDINATION OF CARE: 10:02 PM-Discussed treatment plan which includes X-ray of T-spine with pt at bedside and pt agreed to plan.   Labs Review Labs Reviewed - No data to display  Imaging Review Dg Thoracic Spine 2 View  09/22/2014   CLINICAL DATA:  Status post fall. Hit upper back on countertop. Concern for thoracic spine injury. Initial encounter.  EXAM: THORACIC SPINE - 2 VIEW  COMPARISON:  Chest radiograph performed 09/19/2014  FINDINGS: There is no evidence of fracture or subluxation. Vertebral bodies demonstrate normal height and alignment. Endplate sclerotic change is noted at multiple levels along the mid thoracic spine.  The visualized portions of both lungs are clear. The mediastinum is unremarkable in appearance.  IMPRESSION: No evidence of fracture or subluxation along the thoracic spine.   Electronically Signed   By: Kathy Robles M.D.   On: 09/22/2014 22:06     EKG Interpretation None      MDM   Final diagnoses:  Back contusion, left, initial encounter    Patients ribs do not appear to be fractured and acute abnormalities on physical exam. Dr. Ethelda Robles has seen the patient face to face and feels comfortable with the plan to discharge. She has Hydrocodone and Lyrica at home for pain control.  68 y.o.Kathy Robles's  with back pain. No neurological deficits and normal neuro exam. Patient can walk. No loss of bowel or bladder control. No concern for cauda equina at this time base on HPI and physical exam findings. No fever, night sweats, weight loss, h/o cancer, IVDU.    Patient Plan 1. Medications: NSAIDs and muscle relaxer. Cont usual home medications unless otherwise directed. 2. Treatment: rest, drink plenty of fluids, gentle stretching as discussed, alternate ice and heat  3. Follow Up: Please followup with your primary doctor for discussion of your diagnoses and further evaluation after today's visit; if you do not have a primary care doctor use the resource guide provided to find one  Advised to follow-up with the orthopedist if symptoms do not start to resolve in the next 2-3 days. If develop loss of bowel or urinary control return to the ED as soon as possible for further evaluation. To take the medications as prescribed as they can cause harm if not taken appropriately.  Vital signs are stable at discharge. Filed Vitals:   09/22/14 2018  BP: 156/53  Pulse: 98  Temp: 99.1 F (37.3 C)  Resp: 18    Patient/guardian has voiced understanding and agreed to follow-up with the PCP or specialist.    I personally performed the services described in this documentation, which was scribed in my presence. The recorded information has been reviewed and is accurate.    Kathy Pel, PA-C 09/22/14 2220  Kathy Sou, MD 09/23/14 Jacinta Shoe

## 2014-09-22 NOTE — Discharge Instructions (Signed)
Contusion °A contusion is a deep bruise. Contusions are the result of an injury that caused bleeding under the skin. The contusion may turn blue, purple, or yellow. Minor injuries will give you a painless contusion, but more severe contusions may stay painful and swollen for a few weeks.  °CAUSES  °A contusion is usually caused by a blow, trauma, or direct force to an area of the body. °SYMPTOMS  °· Swelling and redness of the injured area. °· Bruising of the injured area. °· Tenderness and soreness of the injured area. °· Pain. °DIAGNOSIS  °The diagnosis can be made by taking a history and physical exam. An X-ray, CT scan, or MRI may be needed to determine if there were any associated injuries, such as fractures. °TREATMENT  °Specific treatment will depend on what area of the body was injured. In general, the best treatment for a contusion is resting, icing, elevating, and applying cold compresses to the injured area. Over-the-counter medicines may also be recommended for pain control. Ask your caregiver what the best treatment is for your contusion. °HOME CARE INSTRUCTIONS  °· Put ice on the injured area. °¨ Put ice in a plastic bag. °¨ Place a towel between your skin and the bag. °¨ Leave the ice on for 15-20 minutes, 3-4 times a day, or as directed by your health care provider. °· Only take over-the-counter or prescription medicines for pain, discomfort, or fever as directed by your caregiver. Your caregiver may recommend avoiding anti-inflammatory medicines (aspirin, ibuprofen, and naproxen) for 48 hours because these medicines may increase bruising. °· Rest the injured area. °· If possible, elevate the injured area to reduce swelling. °SEEK IMMEDIATE MEDICAL CARE IF:  °· You have increased bruising or swelling. °· You have pain that is getting worse. °· Your swelling or pain is not relieved with medicines. °MAKE SURE YOU:  °· Understand these instructions. °· Will watch your condition. °· Will get help right  away if you are not doing well or get worse. °Document Released: 03/18/2005 Document Revised: 06/13/2013 Document Reviewed: 04/13/2011 °[employer]® Patient Information ©2015 [employer]. This information is not intended to replace advice given to you by your health care provider. Make sure you discuss any questions you have with your health care provider. ° °

## 2014-09-22 NOTE — ED Notes (Signed)
Presents post fall- fall occurred last night while attempting to get a fry daddy down from the refridgerator, pt slipped and hit left thoracic area of back on rounded counter top. C/o pain with cough and breathing. Denies hitting head denies use of blood thinners. Bilateral breath sounds clear, equal expansion bilaterally.

## 2014-10-02 ENCOUNTER — Other Ambulatory Visit: Payer: Self-pay

## 2014-10-02 DIAGNOSIS — Z1231 Encounter for screening mammogram for malignant neoplasm of breast: Secondary | ICD-10-CM

## 2014-11-16 ENCOUNTER — Ambulatory Visit
Admission: RE | Admit: 2014-11-16 | Discharge: 2014-11-16 | Disposition: A | Discharge status: Home / Self Care | Payer: Medicare Other | Source: Ambulatory Visit

## 2014-11-16 DIAGNOSIS — Z1231 Encounter for screening mammogram for malignant neoplasm of breast: Secondary | ICD-10-CM

## 2015-03-25 ENCOUNTER — Other Ambulatory Visit: Payer: Self-pay | Admitting: Family Medicine

## 2015-03-26 LAB — CMP12+LP+TP+TSH+6AC+CBC/D/PLT
ALK PHOS: 78 IU/L (ref 39–117)
ALT: 15 IU/L (ref 0–32)
AST: 21 IU/L (ref 0–40)
Albumin/Globulin Ratio: 1.8 (ref 1.1–2.5)
Albumin: 4.4 g/dL (ref 3.6–4.8)
BASOS: 0 %
BILIRUBIN TOTAL: 0.4 mg/dL (ref 0.0–1.2)
BUN / CREAT RATIO: 21 (ref 11–26)
BUN: 26 mg/dL (ref 8–27)
Basophils Absolute: 0 10*3/uL (ref 0.0–0.2)
CALCIUM: 9.7 mg/dL (ref 8.7–10.3)
CHLORIDE: 97 mmol/L (ref 97–108)
CHOL/HDL RATIO: 2.9 ratio (ref 0.0–4.4)
CHOLESTEROL TOTAL: 155 mg/dL (ref 100–199)
Creatinine, Ser: 1.25 mg/dL — ABNORMAL HIGH (ref 0.57–1.00)
EOS (ABSOLUTE): 0.1 10*3/uL (ref 0.0–0.4)
EOS: 2 %
FREE THYROXINE INDEX: 3 (ref 1.2–4.9)
GFR calc non Af Amer: 44 mL/min/{1.73_m2} — ABNORMAL LOW (ref >59)
GFR, EST AFRICAN AMERICAN: 51 mL/min/{1.73_m2} — AB (ref >59)
GGT: 14 IU/L (ref 0–60)
Globulin, Total: 2.5 g/dL (ref 1.5–4.5)
Glucose: 87 mg/dL (ref 65–99)
HDL: 53 mg/dL (ref >39)
HEMATOCRIT: 41.9 % (ref 34.0–46.6)
HEMOGLOBIN: 14 g/dL (ref 11.1–15.9)
IMMATURE GRANS (ABS): 0 10*3/uL (ref 0.0–0.1)
IMMATURE GRANULOCYTES: 0 %
Iron: 74 ug/dL (ref 27–139)
LDH: 216 IU/L (ref 119–226)
LDL CALC: 80 mg/dL (ref 0–99)
LYMPHS: 24 %
Lymphocytes Absolute: 1.7 10*3/uL (ref 0.7–3.1)
MCH: 28.5 pg (ref 26.6–33.0)
MCHC: 33.4 g/dL (ref 31.5–35.7)
MCV: 85 fL (ref 79–97)
Monocytes Absolute: 0.5 10*3/uL (ref 0.1–0.9)
Monocytes: 7 %
NEUTROS ABS: 4.8 10*3/uL (ref 1.4–7.0)
NEUTROS PCT: 67 %
POTASSIUM: 4.5 mmol/L (ref 3.5–5.2)
Phosphorus: 3.9 mg/dL (ref 2.5–4.5)
Platelets: 235 10*3/uL (ref 150–379)
RBC: 4.92 x10E6/uL (ref 3.77–5.28)
RDW: 13.6 % (ref 12.3–15.4)
SODIUM: 139 mmol/L (ref 134–144)
T3 Uptake Ratio: 29 % (ref 24–39)
T4, Total: 10.3 ug/dL (ref 4.5–12.0)
TRIGLYCERIDES: 112 mg/dL (ref 0–149)
TSH: 2.9 u[IU]/mL (ref 0.450–4.500)
Total Protein: 6.9 g/dL (ref 6.0–8.5)
Uric Acid: 9 mg/dL — ABNORMAL HIGH (ref 2.5–7.1)
VLDL CHOLESTEROL CAL: 22 mg/dL (ref 5–40)
WBC: 7.1 10*3/uL (ref 3.4–10.8)

## 2015-03-26 LAB — HGB A1C W/O EAG: Hgb A1c MFr Bld: 7.2 % — ABNORMAL HIGH (ref 4.8–5.6)

## 2015-07-11 ENCOUNTER — Emergency Department (HOSPITAL_COMMUNITY)
Admission: EM | Admit: 2015-07-11 | Discharge: 2015-07-11 | Disposition: A | Discharge status: Home / Self Care | Payer: No Typology Code available for payment source | Attending: Emergency Medicine | Admitting: Emergency Medicine

## 2015-07-11 ENCOUNTER — Encounter (HOSPITAL_COMMUNITY): Payer: Self-pay | Admitting: *Deleted

## 2015-07-11 ENCOUNTER — Emergency Department (HOSPITAL_COMMUNITY): Payer: No Typology Code available for payment source

## 2015-07-11 DIAGNOSIS — R42 Dizziness and giddiness: Secondary | ICD-10-CM | POA: Insufficient documentation

## 2015-07-11 DIAGNOSIS — Z7982 Long term (current) use of aspirin: Secondary | ICD-10-CM | POA: Insufficient documentation

## 2015-07-11 DIAGNOSIS — I2510 Atherosclerotic heart disease of native coronary artery without angina pectoris: Secondary | ICD-10-CM | POA: Diagnosis not present

## 2015-07-11 DIAGNOSIS — I10 Essential (primary) hypertension: Secondary | ICD-10-CM | POA: Diagnosis not present

## 2015-07-11 DIAGNOSIS — Z87891 Personal history of nicotine dependence: Secondary | ICD-10-CM | POA: Diagnosis not present

## 2015-07-11 DIAGNOSIS — R05 Cough: Secondary | ICD-10-CM | POA: Diagnosis not present

## 2015-07-11 DIAGNOSIS — E785 Hyperlipidemia, unspecified: Secondary | ICD-10-CM | POA: Diagnosis not present

## 2015-07-11 DIAGNOSIS — E119 Type 2 diabetes mellitus without complications: Secondary | ICD-10-CM | POA: Insufficient documentation

## 2015-07-11 DIAGNOSIS — Z8719 Personal history of other diseases of the digestive system: Secondary | ICD-10-CM | POA: Insufficient documentation

## 2015-07-11 DIAGNOSIS — Z8669 Personal history of other diseases of the nervous system and sense organs: Secondary | ICD-10-CM | POA: Diagnosis not present

## 2015-07-11 DIAGNOSIS — Z86711 Personal history of pulmonary embolism: Secondary | ICD-10-CM | POA: Diagnosis not present

## 2015-07-11 DIAGNOSIS — G43909 Migraine, unspecified, not intractable, without status migrainosus: Secondary | ICD-10-CM | POA: Insufficient documentation

## 2015-07-11 DIAGNOSIS — Z794 Long term (current) use of insulin: Secondary | ICD-10-CM | POA: Diagnosis not present

## 2015-07-11 DIAGNOSIS — R112 Nausea with vomiting, unspecified: Secondary | ICD-10-CM | POA: Diagnosis present

## 2015-07-11 DIAGNOSIS — R197 Diarrhea, unspecified: Secondary | ICD-10-CM | POA: Insufficient documentation

## 2015-07-11 LAB — CBC WITH DIFFERENTIAL/PLATELET
Basophils Absolute: 0 10*3/uL (ref 0.0–0.1)
Basophils Relative: 0 %
EOS ABS: 0 10*3/uL (ref 0.0–0.7)
EOS PCT: 0 %
HCT: 37.2 % (ref 36.0–46.0)
Hemoglobin: 12.3 g/dL (ref 12.0–15.0)
LYMPHS ABS: 0.5 10*3/uL — AB (ref 0.7–4.0)
LYMPHS PCT: 5 %
MCH: 28.2 pg (ref 26.0–34.0)
MCHC: 33.1 g/dL (ref 30.0–36.0)
MCV: 85.3 fL (ref 78.0–100.0)
MONOS PCT: 6 %
Monocytes Absolute: 0.6 10*3/uL (ref 0.1–1.0)
Neutro Abs: 8.9 10*3/uL — ABNORMAL HIGH (ref 1.7–7.7)
Neutrophils Relative %: 89 %
PLATELETS: 153 10*3/uL (ref 150–400)
RBC: 4.36 MIL/uL (ref 3.87–5.11)
RDW: 12.9 % (ref 11.5–15.5)
WBC: 10 10*3/uL (ref 4.0–10.5)

## 2015-07-11 LAB — COMPREHENSIVE METABOLIC PANEL
ALBUMIN: 2.7 g/dL — AB (ref 3.5–5.0)
ALT: 12 U/L — AB (ref 14–54)
AST: 29 U/L (ref 15–41)
Alkaline Phosphatase: 64 U/L (ref 38–126)
Anion gap: 8 (ref 5–15)
BUN: 17 mg/dL (ref 6–20)
CHLORIDE: 106 mmol/L (ref 101–111)
CO2: 26 mmol/L (ref 22–32)
CREATININE: 1.16 mg/dL — AB (ref 0.44–1.00)
Calcium: 8.3 mg/dL — ABNORMAL LOW (ref 8.9–10.3)
GFR calc Af Amer: 55 mL/min — ABNORMAL LOW (ref >60)
GFR, EST NON AFRICAN AMERICAN: 47 mL/min — AB (ref >60)
GLUCOSE: 142 mg/dL — AB (ref 65–99)
POTASSIUM: 3.5 mmol/L (ref 3.5–5.1)
SODIUM: 140 mmol/L (ref 135–145)
Total Bilirubin: 0.7 mg/dL (ref 0.3–1.2)
Total Protein: 5.3 g/dL — ABNORMAL LOW (ref 6.5–8.1)

## 2015-07-11 LAB — URINE MICROSCOPIC-ADD ON: RBC / HPF: NONE SEEN RBC/hpf (ref 0–5)

## 2015-07-11 LAB — URINALYSIS, ROUTINE W REFLEX MICROSCOPIC
BILIRUBIN URINE: NEGATIVE
HGB URINE DIPSTICK: NEGATIVE
Ketones, ur: NEGATIVE mg/dL
Leukocytes, UA: NEGATIVE
Nitrite: NEGATIVE
PH: 5 (ref 5.0–8.0)
Protein, ur: NEGATIVE mg/dL
SPECIFIC GRAVITY, URINE: 1.025 (ref 1.005–1.030)

## 2015-07-11 LAB — TROPONIN I: TROPONIN I: 0.03 ng/mL (ref <0.031)

## 2015-07-11 LAB — I-STAT CG4 LACTIC ACID, ED: LACTIC ACID, VENOUS: 1.89 mmol/L (ref 0.5–2.0)

## 2015-07-11 MED ORDER — ONDANSETRON HCL 4 MG/2ML IJ SOLN
4.0000 mg | Freq: Once | INTRAMUSCULAR | Status: AC
Start: 2015-07-11 — End: 2015-07-11
  Administered 2015-07-11: 4 mg via INTRAVENOUS
  Filled 2015-07-11: qty 2

## 2015-07-11 MED ORDER — ACETAMINOPHEN 500 MG PO TABS
1000.0000 mg | ORAL_TABLET | Freq: Once | ORAL | Status: AC
  Administered 2015-07-11: 1000 mg via ORAL
  Filled 2015-07-11: qty 2

## 2015-07-11 MED ORDER — SODIUM CHLORIDE 0.9 % IV BOLUS (SEPSIS)
1000.0000 mL | Freq: Once | INTRAVENOUS | Status: AC
  Administered 2015-07-11: 1000 mL via INTRAVENOUS

## 2015-07-11 MED ORDER — ONDANSETRON HCL 4 MG PO TABS: 4.0000 mg | ORAL_TABLET | Freq: Three times a day (TID) | ORAL | Status: DC | PRN

## 2015-07-11 NOTE — ED Notes (Signed)
Patient transported to X-ray 

## 2015-07-11 NOTE — Discharge Instructions (Signed)
Return without fail for worsening symptoms including difficulty breathing, confusion, abdominal pain, vomiting and unable to keep down food or fluids despite nausea medications, or any other symptoms concerning to you. Please see your primary care doctor within the next few days, and is able to leave him a stool sample to be tested for C. Difficile, as you are unable to leave a stool sample here today.  Diarrhea Diarrhea is frequent loose and watery bowel movements. It can cause you to feel weak and dehydrated. Dehydration can cause you to become tired and thirsty, have a dry mouth, and have decreased urination that often is dark yellow. Diarrhea is a sign of another problem, most often an infection that will not last long. In most cases, diarrhea typically lasts 2-3 days. However, it can last longer if it is a sign of something more serious. It is important to treat your diarrhea as directed by your caregiver to lessen or prevent future episodes of diarrhea. CAUSES  Some common causes include:  Gastrointestinal infections caused by viruses, bacteria, or parasites.  Food poisoning or food allergies.  Certain medicines, such as antibiotics, chemotherapy, and laxatives.  Artificial sweeteners and fructose.  Digestive disorders. HOME CARE INSTRUCTIONS  Ensure adequate fluid intake (hydration): Have 1 cup (8 oz) of fluid for each diarrhea episode. Avoid fluids that contain simple sugars or sports drinks, fruit juices, whole milk products, and sodas. Your urine should be clear or pale yellow if you are drinking enough fluids. Hydrate with an oral rehydration solution that you can purchase at pharmacies, retail stores, and online. You can prepare an oral rehydration solution at home by mixing the following ingredients together:   - tsp table salt.   tsp baking soda.   tsp salt substitute containing potassium chloride.  1  tablespoons sugar.  1 L (34 oz) of water.  Certain foods and beverages  may increase the speed at which food moves through the gastrointestinal (GI) tract. These foods and beverages should be avoided and include:  Caffeinated and alcoholic beverages.  High-fiber foods, such as raw fruits and vegetables, nuts, seeds, and whole grain breads and cereals.  Foods and beverages sweetened with sugar alcohols, such as xylitol, sorbitol, and mannitol.  Some foods may be well tolerated and may help thicken stool including:  Starchy foods, such as rice, toast, pasta, low-sugar cereal, oatmeal, grits, baked potatoes, crackers, and bagels.  Bananas.  Applesauce.  Add probiotic-rich foods to help increase healthy bacteria in the GI tract, such as yogurt and fermented milk products.  Wash your hands well after each diarrhea episode.  Only take over-the-counter or prescription medicines as directed by your caregiver.  Take a warm bath to relieve any burning or pain from frequent diarrhea episodes. SEEK IMMEDIATE MEDICAL CARE IF:   You are unable to keep fluids down.  You have persistent vomiting.  You have blood in your stool, or your stools are black and tarry.  You do not urinate in 6-8 hours, or there is only a small amount of very dark urine.  You have abdominal pain that increases or localizes.  You have weakness, dizziness, confusion, or light-headedness.  You have a severe headache.  Your diarrhea gets worse or does not get better.  You have a fever or persistent symptoms for more than 2-3 days.  You have a fever and your symptoms suddenly get worse. MAKE SURE YOU:   Understand these instructions.  Will watch your condition.  Will get help right away if  you are not doing well or get worse.   This information is not intended to replace advice given to you by your health care provider. Make sure you discuss any questions you have with your health care provider.   Document Released: 05/29/2002 Document Revised: 06/29/2014 Document Reviewed:  02/14/2012 Elsevier Interactive Patient Education 2016 [employer]  Nausea and Vomiting Nausea is a sick feeling that often comes before throwing up (vomiting). Vomiting is a reflex where stomach contents come out of your mouth. Vomiting can cause severe loss of body fluids (dehydration). Children and elderly adults can become dehydrated quickly, especially if they also have diarrhea. Nausea and vomiting are symptoms of a condition or disease. It is important to find the cause of your symptoms. CAUSES   Direct irritation of the stomach lining. This irritation can result from increased acid production (gastroesophageal reflux disease), infection, food poisoning, taking certain medicines (such as nonsteroidal anti-inflammatory drugs), alcohol use, or tobacco use.  Signals from the brain.These signals could be caused by a headache, heat exposure, an inner ear disturbance, increased pressure in the brain from injury, infection, a tumor, or a concussion, pain, emotional stimulus, or metabolic problems.  An obstruction in the gastrointestinal tract (bowel obstruction).  Illnesses such as diabetes, hepatitis, gallbladder problems, appendicitis, kidney problems, cancer, sepsis, atypical symptoms of a heart attack, or eating disorders.  Medical treatments such as chemotherapy and radiation.  Receiving medicine that makes you sleep (general anesthetic) during surgery. DIAGNOSIS Your caregiver may ask for tests to be done if the problems do not improve after a few days. Tests may also be done if symptoms are severe or if the reason for the nausea and vomiting is not clear. Tests may include:  Urine tests.  Blood tests.  Stool tests.  Cultures (to look for evidence of infection).  X-rays or other imaging studies. Test results can help your caregiver make decisions about treatment or the need for additional tests. TREATMENT You need to stay well hydrated. Drink frequently but in small  amounts.You may wish to drink water, sports drinks, clear broth, or eat frozen ice pops or gelatin dessert to help stay hydrated.When you eat, eating slowly may help prevent nausea.There are also some antinausea medicines that may help prevent nausea. HOME CARE INSTRUCTIONS   Take all medicine as directed by your caregiver.  If you do not have an appetite, do not force yourself to eat. However, you must continue to drink fluids.  If you have an appetite, eat a normal diet unless your caregiver tells you differently.  Eat a variety of complex carbohydrates (rice, wheat, potatoes, bread), lean meats, yogurt, fruits, and vegetables.  Avoid high-fat foods because they are more difficult to digest.  Drink enough water and fluids to keep your urine clear or pale yellow.  If you are dehydrated, ask your caregiver for specific rehydration instructions. Signs of dehydration may include:  Severe thirst.  Dry lips and mouth.  Dizziness.  Dark urine.  Decreasing urine frequency and amount.  Confusion.  Rapid breathing or pulse. SEEK IMMEDIATE MEDICAL CARE IF:   You have blood or brown flecks (like coffee grounds) in your vomit.  You have black or bloody stools.  You have a severe headache or stiff neck.  You are confused.  You have severe abdominal pain.  You have chest pain or trouble breathing.  You do not urinate at least once every 8 hours.  You develop cold or clammy skin.  You continue to vomit for  longer than 24 to 48 hours.  You have a fever. MAKE SURE YOU:   Understand these instructions.  Will watch your condition.  Will get help right away if you are not doing well or get worse.   This information is not intended to replace advice given to you by your health care provider. Make sure you discuss any questions you have with your health care provider.   Document Released: 06/08/2005 Document Revised: 08/31/2011 Document Reviewed: 11/05/2010 Elsevier  Interactive Patient Education [employer]

## 2015-07-11 NOTE — ED Notes (Signed)
Pt ambulated down hallway. o2 sat's remained above 95%. No dizziness, chest pain or sob while walking.

## 2015-07-11 NOTE — ED Notes (Signed)
MD at the bedside  

## 2015-07-11 NOTE — ED Provider Notes (Signed)
CSN: [id number]     Arrival date & time 07/11/15  1048 History   First MD Initiated Contact with Patient 07/11/15 1054     Chief Complaint  Patient presents with  . Dizziness  . Emesis  . Diarrhea     (Consider location/radiation/quality/duration/timing/severity/associated sxs/prior Treatment) HPI 70 year old female who presents with cough, nausea, vomiting, and diarrhea. History of diabetes, hypertension, hyperlipidemia and CAD. States that for the past 4 weeks she has had cough, productive of sputum, sore throat and runny nose. Multiple people at work has been sick with similar illness. she has been taking cough medication and finished a course of antibiotics through her primary care provider. Still having persistent symptoms, and today while at work began to have multiple episodes of nausea, vomiting, and diarrhea. Denies any abdominal pain, urinary symptoms, chest pain or difficulty breathing. Having subjective fevers and chills. Past Medical History  Diagnosis Date  . Diabetes mellitus   . Migraine   . Hypertension   . Cataract   . Coronary artery disease   . Hyperlipemia   . Neuropathy (HCC)   . Pulmonary embolus (HCC)   . Peritonitis [facility])    Past Surgical History  Procedure Laterality Date  . Abdominal hysterectomy    . Cesarean section    . Abdominal exploration surgery    . Back surgery     Family History  Problem Relation Age of Onset  . Diabetes Mother   . Heart disease Mother   . Diabetes Father    Social History  Substance Use Topics  . Smoking status: Former Smoker    Types: Cigarettes    Quit date: 05/16/2001  . Smokeless tobacco: Never Used  . Alcohol Use: Yes   OB History    No data available     Review of Systems 10/14 systems reviewed and are negative other than those stated in the HPI    Allergies  Coumadin and Ramipril  Home Medications   Prior to Admission medications   Medication Sig Start Date End Date Taking? Authorizing Provider   aspirin EC 81 MG tablet Take 81 mg by mouth daily.   Yes Historical Provider, MD  cloNIDine (CATAPRES) 0.2 MG tablet Take 0.2 mg by mouth 2 (two) times daily.   Yes Historical Provider, MD  HYDROcodone-acetaminophen (VICODIN) 5-500 MG per tablet Take 1 tablet by mouth at bedtime as needed. Pain   Yes Historical Provider, MD  insulin aspart (NOVOLOG) 100 UNIT/ML injection Inject 40-65 Units into the skin 2 (two) times daily. Take 40 units in the morning Take 65 units in the evening   Yes Historical Provider, MD  insulin lispro protamine-lispro (HUMALOG 75/25 MIX) (75-25) 100 UNIT/ML SUSP injection Inject 40-65 Units into the skin 2 (two) times daily with a meal. Take 40 units in the morning Take 65 units in the evening   Yes Historical Provider, MD  losartan (COZAAR) 100 MG tablet Take 100 mg by mouth daily.   Yes Historical Provider, MD  nortriptyline (PAMELOR) 10 MG capsule Take 10 mg by mouth at bedtime.   Yes Historical Provider, MD  pregabalin (LYRICA) 75 MG capsule Take 75 mg by mouth 2 (two) times daily.   Yes Historical Provider, MD  simvastatin (ZOCOR) 40 MG tablet Take 40 mg by mouth every evening.   Yes Historical Provider, MD  ondansetron (ZOFRAN) 4 MG tablet Take 1 tablet (4 mg total) by mouth every 8 (eight) hours as needed for nausea or vomiting. 07/11/15   Neysa Bonito  Liu, MD   BP 122/52 mmHg  Pulse 75  Temp(Src) 99.1 F (37.3 C) (Oral)  Resp 17  Ht 5\' 1"  (1.549 m)  Wt 167 lb (75.751 kg)  BMI 31.57 kg/m2  SpO2 94% Physical Exam Physical Exam  Nursing note and vitals reviewed. Constitutional: Well developed, well nourished, non-toxic, and in no acute distress Head: Normocephalic and atraumatic.  Mouth/Throat: Oropharynx is clear. Dry mucous membranes.  Neck: Normal range of motion. Neck supple.  Cardiovascular: Tachycardic rate and regular rhythm.  No edema. Pulmonary/Chest: Effort normal and breath sounds normal.  Abdominal: Soft. There is no tenderness. There is no  rebound and no guarding.  Musculoskeletal: Normal range of motion.  Neurological: Alert, no facial droop, fluent speech, moves all extremities symmetrically Skin: Skin is warm and dry.  Psychiatric: Cooperative  ED Course  Procedures (including critical care time) Labs Review Labs Reviewed  COMPREHENSIVE METABOLIC PANEL - Abnormal; Notable for the following:    Glucose, Bld 142 (*)    Creatinine, Ser 1.16 (*)    Calcium 8.3 (*)    Total Protein 5.3 (*)    Albumin 2.7 (*)    ALT 12 (*)    GFR calc non Af Amer 47 (*)    GFR calc Af Amer 55 (*)    All other components within normal limits  URINALYSIS, ROUTINE W REFLEX MICROSCOPIC (NOT AT [facility]) - Abnormal; Notable for the following:    Glucose, UA >1000 (*)    All other components within normal limits  CBC WITH DIFFERENTIAL/PLATELET - Abnormal; Notable for the following:    Neutro Abs 8.9 (*)    Lymphs Abs 0.5 (*)    All other components within normal limits  URINE MICROSCOPIC-ADD ON - Abnormal; Notable for the following:    Squamous Epithelial / LPF 0-5 (*)    Bacteria, UA RARE (*)    Casts HYALINE CASTS (*)    All other components within normal limits  CULTURE, BLOOD (ROUTINE X 2)  CULTURE, BLOOD (ROUTINE X 2)  URINE CULTURE  TROPONIN I  I-STAT CG4 LACTIC ACID, ED  I-STAT CG4 LACTIC ACID, ED    Imaging Review Dg Chest 2 View  07/11/2015  CLINICAL DATA:  Cough EXAM: CHEST  2 VIEW COMPARISON:  September 19, 2014 FINDINGS: The heart, hila, and mediastinum are unchanged. Minimal opacity in the left lung base is not significantly changed, likely chronic atelectasis or scar. No acute infiltrate is identified. IMPRESSION: No active cardiopulmonary disease. Electronically Signed   By: September 21, 2014 III M.D   On: 07/11/2015 11:34   I have personally reviewed and evaluated these images and lab results as part of my medical decision-making.   EKG Interpretation   Date/Time:  Thursday July 11 2015 10:52:40 EST Ventricular Rate:   105 PR Interval:  143 QRS Duration: 66 QT Interval:  338 QTC Calculation: 447 R Axis:   31 Text Interpretation:  Sinus tachycardia Probable left atrial enlargement  Low voltage, precordial leads Borderline T abnormalities, inferior leads  Mild ST depression in the lateral precordial leads Confirmed by LIU MD,  DANA 04-06-1992) on 07/11/2015 10:57:51 AM      MDM   Final diagnoses:  Nausea vomiting and diarrhea    70 year old female who presents with nausea, vomiting, and diarrhea. On arrival, is febrile and tachycardic. In no respiratory distress, and normotensive. Has a soft and nontender abdomen and unremarkable cardiopulmonary exam. Blood work revealing no leukocytosis or elevated lactate. UA is negative for infection and chest  x-ray shows no acute cardiopulmonary processes. No evidence of severe dehydration including acute kidney injury and no major electrolyte or metabolic derangements. Patient unable to leave a stool sample here in the setting of recent antibiotic use. At this time, no clear bacterial source of infection. Given lack of abdominal pain does not require CT imaging at this time. The patient feels improved after antiemetics and IV fluids, and able to tolerate by mouth intake. Is otherwise well-appearing, and tachycardia fully resolved after by mouth and IV fluids. At this time, does not require antibiotics. Possible viral source of symptoms. Discussed close outpatient follow-up with her primary care doctor, and C. difficile testing, when she is able to collect a stool sample. Strict return and follow-up instructions are reviewed. She expressed understanding of all discharge instructions and felt comfortable to plan of care.    Lavera Guise, MD 07/11/15 [phone]

## 2015-07-11 NOTE — ED Notes (Signed)
MD at bedside. 

## 2015-07-11 NOTE — ED Notes (Signed)
Patient returned from X-ray 

## 2015-07-11 NOTE — ED Notes (Signed)
Called Phlebotomy to obtain labs

## 2015-07-11 NOTE — ED Notes (Signed)
Pt presents via [facility] from work c/o N/V/D and dizziness.  Pt had an episode of N/V/D last pm, woke up this AM feeling better and went to work.  When she was at work she became dizzy and had 1 episode of vomiting and 2 episodes of diarrhea.  Denies sick contacts.  Hx: vertigo (dx 1.5 weeks ago), pt reports this feels similar, DM2, CAD.  4 Zofran received by EMS with some improvement in nausea.  BP-180sys, HR-110 ST, CBG-275, pt reports cough x 3 weeks also.  Pt a x 4, NAD, no neuro deficits noted.

## 2015-07-11 NOTE — ED Notes (Signed)
Phlebotomy at the bedside  

## 2015-07-12 LAB — URINE CULTURE

## 2015-07-16 LAB — CULTURE, BLOOD (ROUTINE X 2)
CULTURE: NO GROWTH
Culture: NO GROWTH

## 2015-10-14 ENCOUNTER — Other Ambulatory Visit: Payer: Self-pay

## 2015-10-14 DIAGNOSIS — Z1231 Encounter for screening mammogram for malignant neoplasm of breast: Secondary | ICD-10-CM

## 2015-12-16 ENCOUNTER — Ambulatory Visit
Admission: RE | Admit: 2015-12-16 | Discharge: 2015-12-16 | Disposition: A | Discharge status: Home / Self Care | Payer: No Typology Code available for payment source | Source: Ambulatory Visit

## 2015-12-16 DIAGNOSIS — Z1231 Encounter for screening mammogram for malignant neoplasm of breast: Secondary | ICD-10-CM

## 2016-01-20 ENCOUNTER — Other Ambulatory Visit: Payer: Self-pay | Admitting: Family Medicine

## 2016-01-21 LAB — CMP12+LP+TP+TSH+6AC+CBC/D/PLT
ALK PHOS: 87 IU/L (ref 39–117)
ALT: 12 IU/L (ref 0–32)
AST: 17 IU/L (ref 0–40)
Albumin/Globulin Ratio: 1.6 (ref 1.2–2.2)
Albumin: 4 g/dL (ref 3.6–4.8)
BASOS: 0 %
BUN/Creatinine Ratio: 18 (ref 12–28)
BUN: 21 mg/dL (ref 8–27)
Basophils Absolute: 0 10*3/uL (ref 0.0–0.2)
Bilirubin Total: 0.5 mg/dL (ref 0.0–1.2)
CALCIUM: 9.5 mg/dL (ref 8.7–10.3)
CHLORIDE: 96 mmol/L (ref 96–106)
CHOL/HDL RATIO: 2.7 ratio (ref 0.0–4.4)
CREATININE: 1.16 mg/dL — AB (ref 0.57–1.00)
Cholesterol, Total: 146 mg/dL (ref 100–199)
EOS (ABSOLUTE): 0.1 10*3/uL (ref 0.0–0.4)
Eos: 2 %
Estimated CHD Risk: <0.5 times avg. (ref 0.0–1.0)
Free Thyroxine Index: 2.3 (ref 1.2–4.9)
GFR, EST AFRICAN AMERICAN: 56 mL/min/{1.73_m2} — AB (ref >59)
GFR, EST NON AFRICAN AMERICAN: 48 mL/min/{1.73_m2} — AB (ref >59)
GGT: 15 IU/L (ref 0–60)
GLUCOSE: 168 mg/dL — AB (ref 65–99)
Globulin, Total: 2.5 g/dL (ref 1.5–4.5)
HDL: 55 mg/dL (ref >39)
HEMATOCRIT: 39.7 % (ref 34.0–46.6)
Hemoglobin: 13.3 g/dL (ref 11.1–15.9)
IMMATURE GRANS (ABS): 0 10*3/uL (ref 0.0–0.1)
Immature Granulocytes: 0 %
Iron: 67 ug/dL (ref 27–139)
LDH: 255 IU/L — AB (ref 119–226)
LDL CALC: 67 mg/dL (ref 0–99)
LYMPHS: 20 %
Lymphocytes Absolute: 1.6 10*3/uL (ref 0.7–3.1)
MCH: 28.7 pg (ref 26.6–33.0)
MCHC: 33.5 g/dL (ref 31.5–35.7)
MCV: 86 fL (ref 79–97)
MONOCYTES: 8 %
Monocytes Absolute: 0.6 10*3/uL (ref 0.1–0.9)
NEUTROS ABS: 5.5 10*3/uL (ref 1.4–7.0)
Neutrophils: 70 %
PHOSPHORUS: 3.4 mg/dL (ref 2.5–4.5)
POTASSIUM: 4.7 mmol/L (ref 3.5–5.2)
Platelets: 220 10*3/uL (ref 150–379)
RBC: 4.63 x10E6/uL (ref 3.77–5.28)
RDW: 13.9 % (ref 12.3–15.4)
SODIUM: 138 mmol/L (ref 134–144)
T3 Uptake Ratio: 27 % (ref 24–39)
T4 TOTAL: 8.4 ug/dL (ref 4.5–12.0)
TSH: 1.54 u[IU]/mL (ref 0.450–4.500)
Total Protein: 6.5 g/dL (ref 6.0–8.5)
Triglycerides: 119 mg/dL (ref 0–149)
URIC ACID: 7.9 mg/dL — AB (ref 2.5–7.1)
VLDL Cholesterol Cal: 24 mg/dL (ref 5–40)
WBC: 7.8 10*3/uL (ref 3.4–10.8)

## 2016-01-21 LAB — HGB A1C W/O EAG: HEMOGLOBIN A1C: 9.1 % — AB (ref 4.8–5.6)

## 2016-08-11 ENCOUNTER — Ambulatory Visit: Payer: Self-pay | Admitting: Registered Nurse

## 2016-08-11 VITALS — BP 158/78 | HR 78 | Temp 101.3°F | Resp 24

## 2016-08-11 DIAGNOSIS — R69 Illness, unspecified: Principal | ICD-10-CM

## 2016-08-11 DIAGNOSIS — J111 Influenza due to unidentified influenza virus with other respiratory manifestations: Secondary | ICD-10-CM

## 2016-08-11 MED ORDER — OSELTAMIVIR PHOSPHATE 75 MG PO CAPS: 75.0000 mg | ORAL_CAPSULE | Freq: Once | ORAL | Status: DC

## 2016-08-11 MED ORDER — ALBUTEROL SULFATE HFA 108 (90 BASE) MCG/ACT IN AERS: 1.0000 | INHALATION_SPRAY | RESPIRATORY_TRACT | 0 refills | Status: DC | PRN

## 2016-08-11 NOTE — Progress Notes (Signed)
Pt reports productive cough with gray/yellow phlegm since beginning of November. Using Robitussin without relief. Denies any other sx.

## 2016-08-11 NOTE — Patient Instructions (Signed)
Hydrate, hydrate, hydrate if unable to urinate every 8 hours seek medical re-evaluation Continue finger stick checks at home if greater than 300 follow up with PCM Tamiflu 75mg  by mouth every 12 hours x 5 days Albuterol MDI 1-2 puffs by mouth every 4-6 hours as needed for wheezing, chest tightness, protracted cough Tylenol 1000mg  by mouth every 6 hours as needed for body aches/fever Tums as needed for heartburn avoid foods that trigger symptoms follow up with PCM if daily consider daily meds Cough drops by mouth every 2 hours as needed Honey with lemon/tea/water helps with cough

## 2016-08-11 NOTE — Progress Notes (Signed)
Subjective:    Patient ID: Kathy Robles, female    DOB: 01/29/1946, 71 y.o.   MRN: [id number]  71 y/o caucasian female here for evaluation of worsening chronic cough and fever, body aches, diarrhea.  Has noticed increased frequency of heartburn weekly doesn't take any meds.  Had flu vaccination in November at [facility].  Blood sugars have been running a little higher than normal 160s.  Urinating without difficulty.  Mild headache.  Denied hematuria/hematochezia/hematemesis/hemoptysis.      Review of Systems  Constitutional: Positive for chills and diaphoresis. Negative for activity change, appetite change, fatigue, fever and unexpected weight change.  HENT: Positive for congestion, postnasal drip, rhinorrhea, sinus pain and sinus pressure. Negative for dental problem, drooling, ear discharge, ear pain, facial swelling, hearing loss, mouth sores, nosebleeds, sneezing, sore throat, tinnitus, trouble swallowing and voice change.   Eyes: Negative for photophobia, pain, discharge, redness, itching and visual disturbance.  Respiratory: Positive for cough. Negative for choking, chest tightness, shortness of breath, wheezing and stridor.   Cardiovascular: Negative for chest pain, palpitations and leg swelling.  Gastrointestinal: Negative for abdominal distention, abdominal pain, blood in stool, constipation, diarrhea, nausea and vomiting.  Endocrine: Negative for cold intolerance and heat intolerance.  Genitourinary: Negative for difficulty urinating, dysuria and hematuria.  Musculoskeletal: Negative for arthralgias, back pain, gait problem, joint swelling, myalgias, neck pain and neck stiffness.  Skin: Negative for color change, pallor, rash and wound.  Allergic/Immunologic: Positive for environmental allergies. Negative for food allergies.  Neurological: Positive for headaches. Negative for dizziness, tremors, seizures, syncope, facial asymmetry, speech difficulty, weakness, light-headedness and  numbness.  Hematological: Negative for adenopathy. Does not bruise/bleed easily.  Psychiatric/Behavioral: Negative for agitation, behavioral problems, confusion and sleep disturbance.       Objective:   Physical Exam  Constitutional: She is oriented to person, place, and time. She appears well-developed and well-nourished. She is active and cooperative.  Non-toxic appearance. She does not have a sickly appearance. She appears ill. No distress.  HENT:  Head: Normocephalic and atraumatic.  Right Ear: Hearing, external ear and ear canal normal. A middle ear effusion is present.  Left Ear: Hearing, external ear and ear canal normal. A middle ear effusion is present.  Nose: Mucosal edema and rhinorrhea present. No nose lacerations, sinus tenderness, nasal deformity, septal deviation or nasal septal hematoma. No epistaxis.  No foreign bodies. Right sinus exhibits maxillary sinus tenderness and frontal sinus tenderness. Left sinus exhibits maxillary sinus tenderness and frontal sinus tenderness.  Mouth/Throat: Uvula is midline and mucous membranes are normal. Mucous membranes are not pale, not dry and not cyanotic. She does not have dentures. No oral lesions. No trismus in the jaw. Normal dentition. No dental abscesses, uvula swelling, lacerations or dental caries. Posterior oropharyngeal edema and posterior oropharyngeal erythema present. No oropharyngeal exudate or tonsillar abscesses.  Cobblestoning posterior pharynx; macular erythema oropharynx; bilateral TMs air fluid level clear vascular injection; bilateral allergic shiners; maxillary greater than frontal TTP bilaterally  Eyes: Conjunctivae, EOM and lids are normal. Pupils are equal, round, and reactive to light. Right eye exhibits no chemosis, no discharge, no exudate and no hordeolum. No foreign body present in the right eye. Left eye exhibits no chemosis, no discharge, no exudate and no hordeolum. No foreign body present in the left eye. Right  conjunctiva is not injected. Right conjunctiva has no hemorrhage. Left conjunctiva is not injected. Left conjunctiva has no hemorrhage. No scleral icterus. Right eye exhibits normal extraocular motion and no nystagmus. Left  eye exhibits normal extraocular motion and no nystagmus. Right pupil is round and reactive. Left pupil is round and reactive. Pupils are equal.  Neck: Trachea normal and normal range of motion. Neck supple. No tracheal tenderness, no spinous process tenderness and no muscular tenderness present. No neck rigidity. No tracheal deviation, no edema, no erythema and normal range of motion present. No thyroid mass and no thyromegaly present.  Cardiovascular: Normal rate, regular rhythm, S1 normal, S2 normal, normal heart sounds and intact distal pulses.  PMI is not displaced.  Exam reveals no gallop and no friction rub.   No murmur heard. Pulmonary/Chest: Effort normal and breath sounds normal. No accessory muscle usage or stridor. No respiratory distress. She has no decreased breath sounds. She has no wheezes. She has no rhonchi. She has no rales. She exhibits no tenderness.  Speaks full sentences without difficulty; sp02 does not drop with cough/speaking; cough nonproductive observed in exam room  Abdominal: Soft. Bowel sounds are normal. She exhibits no shifting dullness, no distension, no pulsatile liver, no fluid wave, no abdominal bruit, no ascites, no pulsatile midline mass and no mass. There is no hepatosplenomegaly, splenomegaly or hepatomegaly. There is tenderness in the epigastric area. There is no rigidity, no rebound, no guarding, no CVA tenderness, no tenderness at McBurney's point and negative Murphy's sign. A hernia is present. Hernia confirmed positive in the ventral area. Hernia confirmed negative in the right inguinal area and confirmed negative in the left inguinal area.    Dull to percussion RLQ/LLQ; mild tenderness epigastric; tympanny to percussion RUQ/LUQ; normoactive  bowel sounds x 4; ventral hernia with abdominal crunch midline 4cm superior to umbilicus 5x7cm resolves with lying flat on back not TTP  Musculoskeletal: Normal range of motion. She exhibits no edema or tenderness.       Right shoulder: Normal.       Left shoulder: Normal.       Right hip: Normal.       Left hip: Normal.       Right knee: Normal.       Left knee: Normal.       Cervical back: Normal.       Right hand: Normal.       Left hand: Normal.  Lymphadenopathy:       Head (right side): No submental, no submandibular, no tonsillar, no preauricular, no posterior auricular and no occipital adenopathy present.       Head (left side): No submental, no submandibular, no tonsillar, no preauricular, no posterior auricular and no occipital adenopathy present.    She has no cervical adenopathy.       Right cervical: No superficial cervical, no deep cervical and no posterior cervical adenopathy present.      Left cervical: No superficial cervical, no deep cervical and no posterior cervical adenopathy present.  Neurological: She is alert and oriented to person, place, and time. She has normal strength. She is not disoriented. She displays no atrophy and no tremor. No cranial nerve deficit or sensory deficit. She exhibits normal muscle tone. She displays no seizure activity. Coordination and gait normal. GCS eye subscore is 4. GCS verbal subscore is 5. GCS motor subscore is 6.  Skin: Skin is warm, dry and intact. No abrasion, no bruising, no burn, no ecchymosis, no laceration, no lesion, no petechiae and no rash noted. She is not diaphoretic. No cyanosis or erythema. No pallor. Nails show no clubbing.  Psychiatric: She has a normal mood and affect. Her speech is  normal and behavior is normal. Judgment and thought content normal. Cognition and memory are normal.  Nursing note and vitals reviewed.         Assessment & Plan:  A-influenza like illness; ventral hernia  P-Electronic Rx tamiflu 75mg   po BID x 5 days #10 RF0 and albuterol 1-2 puffs po q4-6h prn protracted cough, wheeze, chest tightness.  Hydrate hydrate hydrate.if unable to maintain po intake or urine orange/brown/unable to void every 8 hours follow up with PCM/UCC/ER for re-evaluation.  If blood sugar greater than 300 follow up for re-evaluation.  Tylenol 1000mg  po q6h prn pain/fever.  Given 8 ud from clinic stock along with cough lozenges po q2h prn cough.  Honey with lemon.  Avoid motrin/advil/aleve/naproxen due to diabetes and hypertension.  Suspect Viral illness: no evidence of invasive bacterial infection, non toxic and well hydrated.  This is most likely self limiting viral infection.  I do not see where any further testing or imaging is necessary at this time.   I will suggest supportive care, rest, good hygiene and encourage the patient to take adequate fluids.  Does not require work excuse. Patient to talk with supervisor discussed recommend staying home until afebrile 24 hours off tylenol. nasal saline 1-2 sprays each nostril prn q2h,.  Discussed honey with lemon and salt water gargles for comfort also.  The patient is to return to clinic or EMERGENCY ROOM if symptoms worsen or change significantly e.g. Dyspnea, dysphagia, vomiting, lethargy, SOB, wheezing. Patient verbalized agreement and understanding of treatment plan and had no further questions at this time.   Discussed with patient to follow up with PCM/ER/UCC if worsening abdominal pain, hematochezia, hematuria, hemoptysis, hematemesis or bulge increasing.  Patient verbalized understanding information/instructions, agreed with plan of care and had no further questions at this time.

## 2016-11-10 ENCOUNTER — Other Ambulatory Visit: Payer: Self-pay | Admitting: Family Medicine

## 2016-11-10 DIAGNOSIS — Z1231 Encounter for screening mammogram for malignant neoplasm of breast: Secondary | ICD-10-CM

## 2017-01-05 ENCOUNTER — Ambulatory Visit
Admission: RE | Admit: 2017-01-05 | Discharge: 2017-01-05 | Disposition: A | Discharge status: Home / Self Care | Payer: No Typology Code available for payment source | Source: Ambulatory Visit | Attending: Family Medicine | Admitting: Family Medicine

## 2017-01-05 DIAGNOSIS — Z1231 Encounter for screening mammogram for malignant neoplasm of breast: Secondary | ICD-10-CM

## 2017-01-12 ENCOUNTER — Ambulatory Visit: Payer: Self-pay | Admitting: *Deleted

## 2017-01-12 VITALS — BP 142/70 | HR 56 | Ht 59.0 in | Wt 168.0 lb

## 2017-01-12 DIAGNOSIS — R7989 Other specified abnormal findings of blood chemistry: Secondary | ICD-10-CM

## 2017-01-12 DIAGNOSIS — Z0000 Encounter for general adult medical examination without abnormal findings: Secondary | ICD-10-CM

## 2017-01-12 NOTE — Progress Notes (Signed)
[employer] insurance premium discount evaluation: Labs Drawn. Replacements ROI form signed. Tobacco Free Attestation form signed.  Forms placed in paper chart.  Ok to route results to PCP.  Plan to recheck BP at review appt as she checks it at home and it is usually <140/80.

## 2017-01-13 LAB — VITAMIN D 25 HYDROXY (VIT D DEFICIENCY, FRACTURES): VIT D 25 HYDROXY: 54.1 ng/mL (ref 30.0–100.0)

## 2017-01-13 LAB — CMP12+LP+TP+TSH+6AC+CBC/D/PLT
A/G RATIO: 1.7 (ref 1.2–2.2)
ALBUMIN: 4.1 g/dL (ref 3.5–4.8)
ALT: 13 IU/L (ref 0–32)
AST: 19 IU/L (ref 0–40)
Alkaline Phosphatase: 79 IU/L (ref 39–117)
BASOS ABS: 0 10*3/uL (ref 0.0–0.2)
BILIRUBIN TOTAL: 0.5 mg/dL (ref 0.0–1.2)
BUN / CREAT RATIO: 23 (ref 12–28)
BUN: 24 mg/dL (ref 8–27)
Basos: 0 %
CHOLESTEROL TOTAL: 155 mg/dL (ref 100–199)
Calcium: 9.9 mg/dL (ref 8.7–10.3)
Chloride: 97 mmol/L (ref 96–106)
Chol/HDL Ratio: 3.4 ratio (ref 0.0–4.4)
Creatinine, Ser: 1.06 mg/dL — ABNORMAL HIGH (ref 0.57–1.00)
EOS (ABSOLUTE): 0.2 10*3/uL (ref 0.0–0.4)
EOS: 3 %
Estimated CHD Risk: 0.5 times avg. (ref 0.0–1.0)
FREE THYROXINE INDEX: 2.4 (ref 1.2–4.9)
GFR calc non Af Amer: 53 mL/min/{1.73_m2} — ABNORMAL LOW (ref >59)
GFR, EST AFRICAN AMERICAN: 61 mL/min/{1.73_m2} (ref >59)
GGT: 19 IU/L (ref 0–60)
GLUCOSE: 277 mg/dL — AB (ref 65–99)
Globulin, Total: 2.4 g/dL (ref 1.5–4.5)
HDL: 45 mg/dL (ref >39)
HEMOGLOBIN: 14.2 g/dL (ref 11.1–15.9)
Hematocrit: 42 % (ref 34.0–46.6)
IMMATURE GRANULOCYTES: 0 %
IRON: 91 ug/dL (ref 27–139)
Immature Grans (Abs): 0 10*3/uL (ref 0.0–0.1)
LDH: 246 IU/L — ABNORMAL HIGH (ref 119–226)
LDL Calculated: 80 mg/dL (ref 0–99)
LYMPHS ABS: 1.4 10*3/uL (ref 0.7–3.1)
LYMPHS: 26 %
MCH: 29.3 pg (ref 26.6–33.0)
MCHC: 33.8 g/dL (ref 31.5–35.7)
MCV: 87 fL (ref 79–97)
MONOCYTES: 7 %
Monocytes Absolute: 0.4 10*3/uL (ref 0.1–0.9)
NEUTROS PCT: 64 %
Neutrophils Absolute: 3.5 10*3/uL (ref 1.4–7.0)
PLATELETS: 199 10*3/uL (ref 150–379)
Phosphorus: 3.7 mg/dL (ref 2.5–4.5)
Potassium: 4.6 mmol/L (ref 3.5–5.2)
RBC: 4.84 x10E6/uL (ref 3.77–5.28)
RDW: 13.8 % (ref 12.3–15.4)
Sodium: 138 mmol/L (ref 134–144)
T3 UPTAKE RATIO: 28 % (ref 24–39)
T4, Total: 8.7 ug/dL (ref 4.5–12.0)
TOTAL PROTEIN: 6.5 g/dL (ref 6.0–8.5)
TSH: 1.95 u[IU]/mL (ref 0.450–4.500)
Triglycerides: 150 mg/dL — ABNORMAL HIGH (ref 0–149)
Uric Acid: 7.1 mg/dL (ref 2.5–7.1)
VLDL CHOLESTEROL CAL: 30 mg/dL (ref 5–40)
WBC: 5.5 10*3/uL (ref 3.4–10.8)

## 2017-01-13 LAB — HGB A1C W/O EAG: Hgb A1c MFr Bld: 8.8 % — ABNORMAL HIGH (ref 4.8–5.6)

## 2017-01-14 NOTE — Progress Notes (Signed)
Results reviewed with pt. A1c elevated. She reports 4 months ago at endocrinologist (not on Epic) that A1c was 7.2. Endorses large amounts of stress as contributing factor to increased glucose/A1c. 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 Joined a gym last week and has begun a walking program to help with this. BP rechecked today. 152/60. Alternative form given to take to endocrinologist in September. Labs routed to PCP and endocrinologist.

## 2017-06-10 ENCOUNTER — Ambulatory Visit: Payer: Self-pay | Admitting: Registered Nurse

## 2017-06-10 VITALS — BP 154/83 | HR 69 | Temp 99.6°F

## 2017-06-10 DIAGNOSIS — M25532 Pain in left wrist: Secondary | ICD-10-CM

## 2017-06-10 DIAGNOSIS — M79645 Pain in left finger(s): Secondary | ICD-10-CM

## 2017-06-10 DIAGNOSIS — L03114 Cellulitis of left upper limb: Secondary | ICD-10-CM

## 2017-06-10 MED ORDER — ACETAMINOPHEN 500 MG PO TABS: 1000.0000 mg | ORAL_TABLET | Freq: Four times a day (QID) | ORAL | 0 refills | Status: AC | PRN

## 2017-06-10 MED ORDER — SULFAMETHOXAZOLE-TRIMETHOPRIM 800-160 MG PO TABS: 1.0000 | ORAL_TABLET | Freq: Two times a day (BID) | ORAL | 0 refills | Status: AC
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
## 2017-06-11 NOTE — Patient Instructions (Signed)
Wrist Fracture Treated With Immobilization A wrist fracture is a break or crack in one of the bones of your wrist. Your wrist is made up of eight small bones at the palm of your hand (carpal bones) and two long bones that make up your forearm (radius and ulna). If the joint is stable and the bones are still in their normal position (nondisplaced), the injury may be treated with immobilization. This involves the use of a cast, splint, or sling to hold your arm in place. Immobilization ensures that your bones continue to stay in the correct position while your arm is healing. What are the causes? This condition may be caused by:  A direct force to the wrist.  Falling on an outstretched hand.  Trauma, such as a car accident or a fall.  What increases the risk? This condition is more likely to develop in people who:  Do contact and high-risk sports, such as skiing, biking, and ice skating.  Take steroid medicines.  Smoke.  Are female.  Are Caucasian.  Drink more than three alcoholic beverages per day.  Have low or lowered bone density (osteoporosis or osteopenia).  Are older.  Have a history of previous fractures.  What are the signs or symptoms? Symptoms of this condition include:  Pain.  Swelling.  Bruising.  Not being able to move the wrist normally.  Additionally, the wrist may hang in an odd position or appear deformed. How is this diagnosed? This condition may be diagnosed based on a physical exam and X-rays. You may also have a CT scan or MRI. How is this treated? Treatment for this condition involves wearing a cast or splint until the injured area is stable enough for you to begin range-of-motion exercises. You also may be given a sling. You may also be prescribed pain medicine. Follow these instructions at home: If you have a splint:  Wear the splint as told by your health care provider. Remove it only as told by your health care provider.  Loosen the splint  if your fingers tingle, become numb, or turn cold and blue.  Do not let your splint get wet if it is not waterproof.  Keep the splint clean. If you have a sling:  Wear it as told by your health care provider. Remove it only as told by your health care provider. If you have a cast:  Do not stick anything inside the cast to scratch your skin. Doing that increases your risk of infection.  Check the skin around the cast every day. Report any concerns to your health care provider. You may put lotion on dry skin around the edges of the cast. Do not apply lotion to the skin underneath the cast.  Do not let your cast get wet if it is not waterproof.  Keep the cast clean. Bathing  Do not take baths, swim, or use a hot tub until your health care provider approves. Ask your health care provider if you can take showers. You may only be allowed to take sponge baths for bathing.  If your cast or splint is not waterproof, cover it with a watertight plastic bag when you take a bath or a shower.  If you have a sling, remove it for bathing only if your health care provider tells you that it is safe to do that. Managing pain, stiffness, and swelling  If directed, apply ice to the injured area. ? Put ice in a plastic bag. ? Place a towel between your  skin and the bag. ? Leave the ice on for 20 minutes, 2-3 times per day.  Move your fingers often to avoid stiffness and to lessen swelling.  Raise (elevate) the injured area above the level of your heart while you are sitting or lying down. Driving  Do not drive or operate heavy machinery while taking prescription pain medicine.  Ask your health care provider when it is safe to drive if you have a cast, splint, or sling on your wrist. Activity  Return to your normal activities as told by your health care provider. Ask your health care provider what activities are safe for you.  Do range-of-motion exercises only as told by your health care provider  or physical therapist. General instructions  Do not put pressure on any part of the cast or splint until it is fully hardened. This may take several hours.  Do not use any tobacco products, such as cigarettes, chewing tobacco, and e-cigarettes. Tobacco can delay bone healing. If you need help quitting, ask your health care provider.  Take over-the-counter and prescription medicines only as told by your health care provider.  Keep all follow-up visits as told by your health care provider. This is important. Contact a health care provider if:  Your cast, splint, or sling is damaged or loose.  You have any new pain, swelling, or bruising.  Your pain, swelling, and bruising do not improve.  You have a fever.  You have chills. Get help right away if:  Your skin or fingers on your injured arm turn blue or gray.  Your arm feels cold or gets numb.  You have severe pain in your injured wrist. This information is not intended to replace advice given to you by your health care provider. Make sure you discuss any questions you have with your health care provider. Document Released: 03/18/2005 Document Revised: 11/20/2015 Document Reviewed: 02/20/2015 Elsevier Interactive Patient Education  2018 [employer] Thumb Sprain A thumb sprain is an injury to one of the strong bands of tissue (ligaments) that connect the bones in your thumb. The ligament can be stretched too much or it can tear. A tear can be either partial or complete. The severity of the sprain depends on how much of the ligament was damaged or torn. What are the causes? A thumb sprain is often caused by a fall or an accident. If you extend your hands to catch an object or to protect yourself, the force of the impact can cause your ligament to stretch too much. This excess tension can also cause your ligament to tear. What increases the risk? This injury is more likely to occur in people who play:  Sports that involve a greater  risk of falling, such as skiing.  Sports that involve catching an object, such as basketball.  What are the signs or symptoms? Symptoms of this condition include:  Loss of motion in your thumb.  Bruising.  Tenderness.  Swelling.  How is this diagnosed? This condition is diagnosed with a medical history and physical exam. You may also have an X-ray of your thumb. How is this treated? Treatment varies depending on the severity of your sprain. If your ligament is overstretched or partially torn, treatment usually involves keeping your thumb in a fixed position (immobilization) for a period of time. To help you do this, your health care provider will apply a bandage, cast, or splint to keep your thumb from moving until it heals. If your ligament is fully torn, you  may need surgery to reconnect the ligament to the bone. After surgery, a cast or splint will be applied and will need to stay on your thumb while it heals. Your health care provider may also suggest exercises or physical therapy to strengthen your thumb. Follow these instructions at home: If you have a cast:  Do not stick anything inside the cast to scratch your skin. Doing that increases your risk of infection.  Check the skin around the cast every day. Report any concerns to your health care provider. You may put lotion on dry skin around the edges of the cast. Do not apply lotion to the skin underneath the cast.  Keep the cast clean and dry. If you have a splint:  Wear it as directed by your health care provider. Remove it only as directed by your health care provider.  Loosen the splint if your fingers become numb and tingle, or if they turn cold and blue.  Keep the splint clean and dry. Bathing  Cover the bandage, cast, or splint with a watertight plastic bag to protect it from water while you take a bath or a shower. Do not let the bandage, cast, or splint get wet. Managing pain, stiffness, and swelling  If  directed, apply ice to the injured area (unless you have a cast): ? Put ice in a plastic bag. ? Place a towel between your skin and the bag. ? Leave the ice on for 20 minutes, 2-3 times per day.  Move your fingers often to avoid stiffness and to lessen swelling.  Raise (elevate) the injured area above the level of your heart while you are sitting or lying down. Driving  Do not drive or operate heavy machinery while taking pain medicine.  Do not drive while wearing a cast or splint on a hand that you use for driving. General instructions  Do not put pressure on any part of your cast or splint until it is fully hardened. This may take several hours.  Take medicines only as directed by your health care provider. These include over-the-counter medicines and prescription medicines.  Keep all follow-up visits as directed by your health care provider. This is important.  Do any exercise or physical therapy as directed by your health care provider.  Do not wear rings on your injured thumb. Contact a health care provider if:  Your pain is not controlled with medicine.  Your bruising or swelling gets worse.  Your cast or splint is damaged. Get help right away if:  Your thumb is numb or blue.  Your thumb feels colder than normal. This information is not intended to replace advice given to you by your health care provider. Make sure you discuss any questions you have with your health care provider. Document Released: 07/16/2004 Document Revised: 02/09/2016 Document Reviewed: 03/20/2014 Elsevier Interactive Patient Education  2018 [employer] Wrist Sprain, Adult A wrist sprain is a stretch or tear in the strong, fibrous tissues (ligaments) that connect your wrist bones. There are three types of wrist sprains:  Grade 1. In this type of sprain, the ligament is stretched more than normal.  Grade 2. In this type of sprain, the ligament is partially torn. You may be able to move your  wrist, but not very much.  Grade 3. In this type of sprain, the ligament or muscle is completely torn. You may find it difficult or extremely painful to move your wrist even a little.  What are the causes? A wrist sprain  can be caused by using the wrist too much during sports, exercise, or at work. It can also happen with a fall or during an accident. What increases the risk? This condition is more likely to occur in people:  With a previous wrist or arm injury.  With poor wrist strength and flexibility.  Who play contact sports, such as football or soccer.  Who play sports that may result in a fall, such as skateboarding, biking, skiing, or snowboarding.  Who do not exercise regularly.  Who use exercise equipment that does not fit well.  What are the signs or symptoms? Symptoms of this condition include:  Pain in the wrist, arm, or hand.  Swelling or bruised skin near the wrist, hand, or arm. The skin may look yellow or kind of blue.  Stiffness or trouble moving the hand.  Hearing a pop or feeling a tear at the time of the injury.  A warm feeling in the skin around the wrist.  How is this diagnosed? This condition is diagnosed with a physical exam. Sometimes an X-ray is taken to make sure a bone did not break. If your health care provider thinks that you tore a ligament, he or she may order an MRI of your wrist. How is this treated? This condition is treated by resting and applying ice to your wrist. Additional treatment may include:  Medicine for pain and inflammation.  A splint to keep your wrist still (immobilized).  Exercises to strengthen and stretch your wrist.  Surgery. This may be done if the ligament is completely torn.  Follow these instructions at home: If you have a splint:   Do not put pressure on any part of the splint until it is fully hardened. This may take several hours.  Wear the splint as told by your health care provider. Remove it only as  told by your health care provider.  Loosen the splint if your fingers tingle, become numb, or turn cold and blue.  If your splint is not waterproof: ? Do not let it get wet. ? Cover it with a watertight covering when you take a bath or a shower.  Keep the splint clean. Managing pain, stiffness, and swelling   If directed, put ice on the injured area. ? If you have a removable splint, remove it as told by your health care provider. ? Put ice in a plastic bag. ? Place a towel between your skin and the bag or between the splint and the bag. ? Leave the ice on for 20 minutes, 2-3 times per day.  Move your fingers often to avoid stiffness and to lessen swelling.  Raise (elevate) the injured area above the level of your heart while you are sitting or lying down. Activity  Rest your wrist. Do not do things that cause pain.  Return to your normal activities as told by your health care provider. Ask your health care provider what activities are safe for you.  Do exercises as told by your health care provider. General instructions  Take over-the-counter and prescription medicines only as told by your health care provider.  Do not use any products that contain nicotine or tobacco, such as cigarettes and e-cigarettes. These can delay healing. If you need help quitting, ask your health care provider.  Ask your health care provider when it is safe to drive if you have a splint.  Keep all follow-up visits as told by your health care provider. This is important. Contact a health care  provider if:  Your pain, bruising, or swelling gets worse.  Your skin becomes red, gets a rash, or has open sores.  Your pain does not get better or it gets worse. Get help right away if:  You have a new or sudden sharp pain in the hand, arm, or wrist.  You have tingling or numbness in your hand.  Your fingers turn white, very red, or cold and blue.  You cannot move your fingers. This information is  not intended to replace advice given to you by your health care provider. Make sure you discuss any questions you have with your health care provider. Document Released: 02/09/2014 Document Revised: 01/04/2016 Document Reviewed: 12/26/2015 Elsevier Interactive Patient Education  2018 [employer]. Cellulitis, Adult Cellulitis is a skin infection. The infected area is usually red and tender. This condition occurs most often in the arms and lower legs. The infection can travel to the muscles, blood, and underlying tissue and become serious. It is very important to get treated for this condition. What are the causes? Cellulitis is caused by bacteria. The bacteria enter through a break in the skin, such as a cut, burn, insect bite, open sore, or crack. What increases the risk? This condition is more likely to occur in people who:  Have a weak defense system (immune system).  Have open wounds on the skin such as cuts, burns, bites, and scrapes. Bacteria can enter the body through these open wounds.  Are older.  Have diabetes.  Have a type of long-lasting (chronic) liver disease (cirrhosis) or kidney disease.  Use IV drugs.  What are the signs or symptoms? Symptoms of this condition include:  Redness, streaking, or spotting on the skin.  Swollen area of the skin.  Tenderness or pain when an area of the skin is touched.  Warm skin.  Fever.  Chills.  Blisters.  How is this diagnosed? This condition is diagnosed based on a medical history and physical exam. You may also have tests, including:  Blood tests.  Lab tests.  Imaging tests.  How is this treated? Treatment for this condition may include:  Medicines, such as antibiotic medicines or antihistamines.  Supportive care, such as rest and application of cold or warm cloths (cold or warm compresses) to the skin.  Hospital care, if the condition is severe.  The infection usually gets better within 1-2 days of  treatment. Follow these instructions at home:  Take over-the-counter and prescription medicines only as told by your health care provider.  If you were prescribed an antibiotic medicine, take it as told by your health care provider. Do not stop taking the antibiotic even if you start to feel better.  Drink enough fluid to keep your urine clear or pale yellow.  Do not touch or rub the infected area.  Raise (elevate) the infected area above the level of your heart while you are sitting or lying down.  Apply warm or cold compresses to the affected area as told by your health care provider.  Keep all follow-up visits as told by your health care provider. This is important. These visits let your health care provider make sure a more serious infection is not developing. Contact a health care provider if:  You have a fever.  Your symptoms do not improve within 1-2 days of starting treatment.  Your bone or joint underneath the infected area becomes painful after the skin has healed.  Your infection returns in the same area or another area.  You  notice a swollen bump in the infected area.  You develop new symptoms.  You have a general ill feeling (malaise) with muscle aches and pains. Get help right away if:  Your symptoms get worse.  You feel very sleepy.  You develop vomiting or diarrhea that persists.  You notice red streaks coming from the infected area.  Your red area gets larger or turns dark in color. This information is not intended to replace advice given to you by your health care provider. Make sure you discuss any questions you have with your health care provider. Document Released: 03/18/2005 Document Revised: 10/17/2015 Document Reviewed: 04/17/2015 Elsevier Interactive Patient Education  [employer].

## 2017-07-06 DIAGNOSIS — M545 Low back pain, unspecified: Secondary | ICD-10-CM | POA: Insufficient documentation

## 2017-07-06 DIAGNOSIS — G8929 Other chronic pain: Secondary | ICD-10-CM | POA: Insufficient documentation

## 2017-07-06 DIAGNOSIS — G894 Chronic pain syndrome: Secondary | ICD-10-CM | POA: Insufficient documentation

## 2017-07-24 DIAGNOSIS — M1990 Unspecified osteoarthritis, unspecified site: Secondary | ICD-10-CM | POA: Insufficient documentation

## 2017-07-24 DIAGNOSIS — M19039 Primary osteoarthritis, unspecified wrist: Secondary | ICD-10-CM | POA: Insufficient documentation

## 2017-10-08 ENCOUNTER — Other Ambulatory Visit: Payer: Self-pay | Admitting: *Deleted

## 2017-10-08 MED ORDER — BENZONATATE 200 MG PO CAPS
200.0000 mg | ORAL_CAPSULE | Freq: Three times a day (TID) | ORAL | 0 refills | Status: DC | PRN
Start: 2017-10-08 — End: 2018-01-18

## 2017-10-08 NOTE — Telephone Encounter (Signed)
Pt emailed RN requesting Rx for [employer]. Sts she was seen at [facility] in clinic on Monday this week and Dx with "virus." C/o dry, nonprod cough. Fever earlier in week, resolved 2 days ago.Rx'd Tussionex and Albuterol Mon.   Tussionex worked okay for a day or two but no longer is working. Then presented to RN clinic stating supervisor was sending her home due to cough being unable to complete phone job duties. Has not inhaler used since early this am. Encouraged to use up to q4h prn when feeling tight in chest with breathing, or ShOB, or having difficulty carrying on conversation. Advised to use when she got to her car as she believes it is there, as audible inspiratory wheezes present during conversation with RN. She denies feeling ShOB currently.   Phone order received from Albina Billet, NP for Tessalon pearles 200mg  po TID prn cough #30 [id number]. Order placed to pt's preferred pharmacy and pt made aware via phone call. No further needs.

## 2017-10-12 NOTE — Telephone Encounter (Signed)
Noted agree with plan of care 

## 2017-10-26 ENCOUNTER — Other Ambulatory Visit: Payer: Self-pay | Admitting: Family Medicine

## 2017-10-26 ENCOUNTER — Ambulatory Visit
Admission: RE | Admit: 2017-10-26 | Discharge: 2017-10-26 | Disposition: A | Discharge status: Home / Self Care | Payer: No Typology Code available for payment source | Source: Ambulatory Visit | Attending: Family Medicine | Admitting: Family Medicine

## 2017-10-26 DIAGNOSIS — R053 Chronic cough: Secondary | ICD-10-CM

## 2017-10-26 DIAGNOSIS — R05 Cough: Secondary | ICD-10-CM

## 2017-12-08 ENCOUNTER — Ambulatory Visit (INDEPENDENT_AMBULATORY_CARE_PROVIDER_SITE_OTHER): Payer: No Typology Code available for payment source | Admitting: Internal Medicine

## 2017-12-08 ENCOUNTER — Encounter: Payer: Self-pay | Admitting: Internal Medicine

## 2017-12-08 VITALS — BP 122/60 | HR 78 | Ht 59.5 in | Wt 174.4 lb

## 2017-12-08 DIAGNOSIS — R058 Other specified cough: Secondary | ICD-10-CM

## 2017-12-08 DIAGNOSIS — R05 Cough: Secondary | ICD-10-CM | POA: Diagnosis not present

## 2017-12-08 MED ORDER — PANTOPRAZOLE SODIUM 40 MG PO TBEC: 40.0000 mg | DELAYED_RELEASE_TABLET | Freq: Every day | ORAL | 2 refills | Status: DC

## 2017-12-08 MED ORDER — FAMOTIDINE 20 MG PO TABS: ORAL_TABLET | ORAL | 11 refills | Status: DC

## 2017-12-08 MED ORDER — TRAMADOL HCL 50 MG PO TABS: 50.0000 mg | ORAL_TABLET | ORAL | 0 refills | Status: DC | PRN
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
## 2017-12-08 NOTE — Patient Instructions (Addendum)
Pantoprazole (protonix) 40 mg   Take  30-60 min before first meal of the day and Pepcid (famotidine)  20 mg one @  bedtime until return to office - this is the best way to tell whether stomach acid is contributing to your problem.    For drainage / throat tickle try take CHLORPHENIRAMINE  4 mg - take one every 4 hours as needed - available over the counter- may cause drowsiness so start with just a bedtime dose or two and see how you tolerate it before trying in daytime    GERD (REFLUX)  is an extremely common cause of respiratory symptoms just like yours , many times with no obvious heartburn at all.    It can be treated with medication, but also with lifestyle changes including elevation of the head of your bed (ideally with 6 inch  bed blocks),  Smoking cessation, avoidance of late meals, excessive alcohol, and avoid fatty foods, chocolate, peppermint, colas, red wine, and acidic juices such as orange juice.  NO MINT OR MENTHOL PRODUCTS SO NO COUGH DROPS   USE SUGARLESS CANDY INSTEAD (Jolley ranchers or Stover's or Life Savers) or even ice chips will also do - the key is to swallow to prevent all throat clearing. NO OIL BASED VITAMINS - use powdered substitutes.   Take delsym two tsp every 12 hours and supplement if needed with  tramadol 50 mg up to 1-2 every 4 hours to suppress the urge to cough. Swallowing water and/or using ice chips/non mint and menthol containing candies (such as lifesavers or sugarless jolly ranchers) are also effective.  You should rest your voice and avoid activities that you know make you cough.  Once you have eliminated the cough for 3 straight days try reducing the tramadol first,  then the delsym as tolerated.    Take your bedtime medicines one hour before bedtime to see if it helps the cough flares you have w/in 10 min of bedtime   Please schedule a follow up office visit in 4 weeks, sooner if needed  with all medications /inhalers/ solutions in hand so we can  verify exactly what you are taking. This includes all medications from all doctors and over the counters

## 2017-12-09 ENCOUNTER — Encounter: Payer: Self-pay | Admitting: Internal Medicine

## 2017-12-09 DIAGNOSIS — R05 Cough: Secondary | ICD-10-CM | POA: Insufficient documentation

## 2017-12-09 DIAGNOSIS — R058 Other specified cough: Secondary | ICD-10-CM | POA: Insufficient documentation

## 2017-12-09 NOTE — Assessment & Plan Note (Addendum)
H/o acei cough 2013 - recurrent dry cough Nov 2018  - cyclical cough rx 12/08/2017     The most common causes of chronic cough in immunocompetent adults include the following: upper airway cough syndrome (UACS), previously referred to as postnasal drip syndrome (PNDS), which is caused by variety of rhinosinus conditions; (2) asthma; (3) GERD; (4) chronic bronchitis from cigarette smoking or other inhaled environmental irritants; (5) nonasthmatic eosinophilic bronchitis; and (6) bronchiectasis.   These conditions, singly or in combination, have accounted for up to 94% of the causes of chronic cough in prospective studies.   Other conditions have constituted no >6% of the causes in prospective studies These have included bronchogenic carcinoma, chronic interstitial pneumonia, sarcoidosis, left ventricular failure, ACEI-induced cough, and aspiration from a condition associated with pharyngeal dysfunction.    Chronic cough is often simultaneously caused by more than one condition. A single cause has been found from 38 to 82% of the time, multiple causes from 18 to 62%. Multiply caused cough has been the result of three diseases up to 42% of the time.      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   These are the same pts (now being labeled as having "irritable larynx syndrome" by some cough centers) who not infrequently have a history of  having failed to tolerate ace inhibitors,  dry powder inhalers or biphosphonates or report having atypical/extraesophageal reflux symptoms that don't respond to standard doses of PPI  and are easily confused as having aecopd or asthma flares by even experienced allergists/ pulmonologists (myself included).    Of the three most common causes of  Sub-acute / recurrent or chronic cough, only one (GERD)  can actually contribute to/ trigger  the other two (asthma and post nasal drip syndrome)  and perpetuate the cylce of cough.  While not intuitively obvious, many patients with chronic low grade reflux do not cough until there is a primary insult that disturbs the protective epithelial barrier and exposes sensitive nerve endings.   This is typically viral but can due to PNDS and  either may apply here.     The point is that once this occurs, it is difficult to eliminate the cycle  using anything but a maximally effective acid suppression regimen at least in the short run, accompanied by an appropriate diet to address non acid GERD and add 1st gen H1 blockers per guidelines   control / eliminate the cough itself for at least 3 days with tramadol if needed    Reviewed with pt The standardized cough guidelines published in Chest by [place] in 2006 are still the best available and consist of a multiple step process (up to 12!) , not a single office visit,  and are intended  to address this problem logically,  with an alogrithm dependent on response to empiric treatment at  each progressive step  to determine a specific diagnosis with  minimal addtional testing needed. Therefore if adherence is an issue or can't be accurately verified,  it's very unlikely the standard evaluation and treatment will be successful here.    Furthermore, response to therapy (other than acute cough suppression, which should only be used short term with avoidance of narcotic containing cough syrups if possible), can be a gradual  process for which the patient is not likely to  perceive immediate benefit.  Unlike going to an eye doctor where the best perscription is almost always the first one and is immediately effective, this is almost never the case in the management of chronic cough syndromes. Therefore the patient needs to commit up front to consistently adhere to recommendations  for up to 6 weeks of therapy directed at the likely underlying problem(s) before the response can be reasonably evaluated.   Total time devoted to counseling  > 50 % of initial 60 min office visit:  review case with pt/ discussion of options/alternatives/ personally creating written customized instructions  in presence of pt  then going over those specific  Instructions directly with the pt including how to use all of the meds but in particular covering each new medication in detail and the difference between the maintenance= "automatic" meds and the prns using an action plan format for the latter (If this problem/symptom => do that organization reading Left to right).  Please see AVS from this visit for a full list of these instructions which I personally wrote for this pt and  are unique to this visit.

## 2017-12-29 ENCOUNTER — Other Ambulatory Visit: Payer: Self-pay | Admitting: Internal Medicine

## 2018-01-04 ENCOUNTER — Other Ambulatory Visit: Payer: Self-pay | Admitting: Internal Medicine

## 2018-01-04 ENCOUNTER — Institutional Professional Consult (permissible substitution): Payer: No Typology Code available for payment source | Admitting: Pulmonary Disease

## 2018-01-04 ENCOUNTER — Other Ambulatory Visit: Payer: Self-pay | Admitting: Registered Nurse

## 2018-01-04 MED ORDER — TRAMADOL HCL 50 MG PO TABS: ORAL_TABLET | ORAL | 0 refills | Status: DC

## 2018-01-06 ENCOUNTER — Telehealth: Payer: Self-pay | Admitting: Registered Nurse

## 2018-01-06 NOTE — Telephone Encounter (Signed)
Please contact patient and ask if labs completed at [facility] due annual labs July 2019

## 2018-01-07 NOTE — Telephone Encounter (Signed)
Spoke with pt via phone. She was Dx'd with RA around the same time of [facility] last year. She reports seeing multiple providers in past year including PCP, Rheumatology, Endocrinology, Ortho, and Pulmonology. Only Pulm is on Epic. She reports A1c and glucose still high due to multiple rounds of prednisone 2/2 RA. Struggling to control RA pain as it is extending into elbows, shoulders and neck. Was just in hands. She sts her endocrinologist is not extremely concerned since they know the reasoning for the elevation and is not making many med changes until RA meds are more stable. Pt scheduled to see endocrinologist again in Aug. Does not recall last A1c. Will email RN updated level after appt next month for our records.

## 2018-01-11 NOTE — Telephone Encounter (Signed)
Noted patient has had follow up and aware HgbA1c elevated and seeing specialists due to prednisone use

## 2018-01-18 ENCOUNTER — Ambulatory Visit (INDEPENDENT_AMBULATORY_CARE_PROVIDER_SITE_OTHER): Payer: No Typology Code available for payment source | Admitting: Internal Medicine

## 2018-01-18 ENCOUNTER — Encounter: Payer: Self-pay | Admitting: Internal Medicine

## 2018-01-18 VITALS — BP 130/72 | HR 74 | Ht 59.5 in | Wt 173.2 lb

## 2018-01-18 DIAGNOSIS — R0609 Other forms of dyspnea: Secondary | ICD-10-CM

## 2018-01-18 DIAGNOSIS — R05 Cough: Secondary | ICD-10-CM | POA: Diagnosis not present

## 2018-01-18 DIAGNOSIS — H6122 Impacted cerumen, left ear: Secondary | ICD-10-CM

## 2018-01-18 DIAGNOSIS — R058 Other specified cough: Secondary | ICD-10-CM

## 2018-01-18 NOTE — Progress Notes (Signed)
Subjective:     Patient ID: Kathy Robles, female   DOB: [birth year],  MRN: [id number]    Brief patient profile:  76 yowf  Quit  "very light"  smoking 2002 with intermittent bronchitis that resolved no resp sequelae with cough due to ACEi in 12/2011 resolved off acei then  new onset cough abruptly in late Nov 2018 s a typical head cold and persisted daily since and onset hand swelling /stiffness in Feb 2019 dx as ra and referred to pulmonary clinic 12/08/2017 by Dr   [name]    History of Present Illness  12/08/2017 1st [facility] office visit/ Javiana Anwar   Chief Complaint  Patient presents with  . Consult    Chronic cough since november of 2018. nonproductive cough.  indolent onset progressive cough dry worse at hs   Keeping  Her up and gags but no vomit / some urinary incont She did take prednisone which helped the RA but not the cough   Kouffman Reflux v Neurogenic Cough Differentiator Reflux Comments  Do you awaken from a sound sleep coughing violently?                            With trouble breathing? Yes occ   Do you have choking episodes when you cannot  Get enough air, gasping for air ?              No but gags   Do you usually cough when you lie down into  The bed, or when you just lie down to rest ?                          p 10 min   Do you usually cough after meals or eating?         No    Do you cough when (or after) you bend over?    no   GERD SCORE     Kouffman Reflux v Neurogenic Cough Differentiator Neurogenic   Do you more-or-less cough all day long? Most of the day   Does change of temperature make you cough? no   Does laughing or chuckling cause you to cough? no   Do fumes (perfume, automobile fumes, burned  Toast, etc.,) cause you to cough ?      no   Does speaking, singing, or talking on the phone cause you to cough   ?               no   Neurogenic/Airway score     rec Pantoprazole (protonix) 40 mg   Take  30-60 min before first meal of the day and  Pepcid (famotidine)  20 mg one @  bedtime until return to office - this is the best way to tell whether stomach acid is contributing to your problem.   For drainage / throat tickle try take CHLORPHENIRAMINE  4 mg - take one every 4 hours as needed -  GERD (REFLUX)  Diet  Take delsym two tsp every 12 hours and supplement if needed with  tramadol 50 mg up to 1-2 every 4 hours to suppress the urge to cough.  Take your bedtime medicines one hour before bedtime to see if it helps the cough flares you have w/in 10 min of bedtime Please schedule a follow up office visit in 4 weeks, sooner if needed  with all medications /inhalers/ solutions in hand  so we can verify exactly what you are taking. This includes all medications from all doctors and over the counters    01/18/2018  f/u ov/Keisuke Hollabaugh re: recurrent cough x Nov 2018 / did not follow instructions/ did not bring meds/ did not prev disclose advair dpi use   Chief Complaint  Patient presents with  . Follow-up    Cough is unchanged. She is using her ventolin 2 x daily on average.   Dyspnea:  MMRC3 = can't walk 100 yards even at a slow pace at a flat grade s stopping due to sob  - can't finish HT Cough: dry/ worse at hs, not using h1 as rec Sleeping: freq wakens coughing L side 6 pillows  SABA use: twice daily  But doesn't think it helps 02: none     No obvious day to day or daytime variability or assoc excess/ purulent sputum or mucus plugs or hemoptysis or cp or chest tightness, subjective wheeze or overt sinus or hb symptoms. Also denies any obvious fluctuation of symptoms with weather or environmental changes or other aggravating or alleviating factors except as outlined above   No unusual exposure hx or h/o childhood pna/ asthma or knowledge of premature birth.  Current Allergies, Complete Past Medical History, Past Surgical History, Family History, and Social History were reviewed in [employer] record.  ROS  The following  are not active complaints unless bolded Hoarseness, sore throat, dysphagia, dental problems, itching, sneezing,  nasal congestion or discharge of excess mucus or purulent secretions, ear ache,   fever, chills, sweats, unintended wt loss or wt gain, classically pleuritic or exertional cp,  orthopnea pnd or arm/hand swelling  or leg swelling, presyncope, palpitations, abdominal pain, anorexia, nausea, vomiting, diarrhea  or change in bowel habits or change in bladder habits, change in stools or change in urine, dysuria, hematuria,  rash, arthralgias, visual complaints, headache, numbness, weakness or ataxia or problems with walking or coordination,  change in mood or  memory.        Current Meds  Medication Sig  . albuterol (PROVENTIL HFA;VENTOLIN HFA) 108 (90 Base) MCG/ACT inhaler Inhale 1-2 puffs into the lungs every 4 (four) hours as needed for wheezing or shortness of breath.  Marland Kitchen aspirin EC 81 MG tablet Take 81 mg by mouth daily.  Marland Kitchen b complex vitamins tablet Take 1 tablet by mouth daily.  Marland Kitchen BYSTOLIC 2.5 MG tablet   . cholecalciferol (VITAMIN D) 1000 units tablet Take 1,000 Units by mouth daily.  . cloNIDine (CATAPRES) 0.2 MG tablet Take 0.2 mg by mouth 2 (two) times daily.  . Coenzyme Q10 (CO Q 10) 100 MG CAPS Take 1 capsule by mouth daily.  . diphenhydramine-acetaminophen (TYLENOL PM) 25-500 MG TABS tablet Take 1 tablet by mouth at bedtime as needed.  . famotidine (PEPCID) 20 MG tablet One at bedtime  . folic acid (FOLVITE) 1 MG tablet Take 1 tablet by mouth daily.  . hydrochlorothiazide (HYDRODIURIL) 12.5 MG tablet   . levothyroxine (SYNTHROID, LEVOTHROID) 25 MCG tablet   . methotrexate (RHEUMATREX) 2.5 MG tablet Take 1 tablet by mouth daily.  Marland Kitchen NOVOLOG MIX 70/30 FLEXPEN (70-30) 100 UNIT/ML FlexPen   . ONETOUCH DELICA LANCETS 33G MISC   . ONETOUCH VERIO test strip   . pantoprazole (PROTONIX) 40 MG tablet Take 1 tablet (40 mg total) by mouth daily. Take 30-60 min before first meal of the day   . predniSONE (DELTASONE) 5 MG tablet Take 2 tablets by mouth daily.  Marland Kitchen  pregabalin (LYRICA) 75 MG capsule Take 75 mg by mouth 2 (two) times daily.  . simvastatin (ZOCOR) 40 MG tablet Take 40 mg by mouth every evening.  . traMADol (ULTRAM) 50 MG tablet TAKE 1 TABLET BY MOUTH EVERY 4 HOURS AS NEEDED FOR COUGH OR  PAIN  FOR  UP  TO  5  DAYS  .   Fluticasone-Salmeterol (ADVAIR) 250-50 MCG/DOSE AEPB Inhale 1 puff into the lungs 2 (two) times daily.                  Objective:   Physical Exam  amb wf nad until starts honking cough      01/18/2018       173   12/08/17 174 lb 6.4 oz (79.1 kg)  01/12/17 168 lb (76.2 kg)  07/11/15 167 lb (75.8 kg)     Vital signs reviewed - Note on arrival 02 sats  97% on RA      HEENT: nl  turbinates bilaterally, and oropharynx.   external ear canals  Blocked by wax on L, no cough reflex/ edentulous  NECK :  without JVD/Nodes/TM/ nl carotid upstrokes bilaterally   LUNGS: no acc muscle use,  Nl contour chest which is clear to A and P bilaterally with  Dry honking cough on insp    CV:  RRR  no s3 or murmur or increase in P2, and no edema   ABD:  soft and nontender with nl inspiratory excursion in the supine position. No bruits or organomegaly appreciated, bowel sounds nl  MS:  Nl gait/ ext warm without deformities, calf tenderness, cyanosis or clubbing No obvious joint restrictions   SKIN: warm and dry without lesions    NEURO:  alert, approp, nl sensorium with  no motor or cerebellar deficits apparent.      I personally reviewed images and agree with radiology impression as follows:  CXR:   10/26/17 No active cardiopulmonary disease.    Assessment:

## 2018-01-18 NOTE — Patient Instructions (Addendum)
stop tylenol pm and the advair and try the over the counter ear wax remover on your Left ear   Only use your albuterol(ventolin) as a rescue medication to be used if you can't catch your breath by resting or doing a relaxed purse lip breathing pattern.  - The less you use it, the better it will work when you need it. - Ok to use up to 2 puffs  every 4 hours if you must but call for immediate appointment if use goes up over your usual need - Don't leave home without it !!  (think of it like the spare tire for your car)   For drainage / throat tickle try take CHLORPHENIRAMINE  4 mg - take one every 4 hours as needed - available over the counter- may cause drowsiness so start with just a bedtime dose or two and see how you tolerate it before trying in daytime    Take delsym two tsp every 12 hours and supplement if needed with  tramadol 50 mg up to 2 every 4 hours to suppress the urge to cough. Swallowing water and/or using ice chips/non mint and menthol containing candies (such as lifesavers or sugarless jolly ranchers) are also effective.  You should rest your voice and avoid activities that you know make you cough.  Once you have eliminated the cough for 3 straight days try reducing the tramadol first,  then the delsym as tolerated.     See Tammy NP w/in 2 weeks or first available  with all medications /inhalers/ solutions in hand so we can verify exactly what you are taking. This includes all medications from all doctors and over the counters - PLEASE separate them into two bags:  the ones you take automatically, no matter what, vs the ones you take just when you feel you need them "BAG #2 is UP TO YOU"  - this will really help [place] help you take your medications more effectively.   ADD:  Recheck L ear for was and consider lavage

## 2018-01-19 NOTE — Telephone Encounter (Signed)
Epic error providers Marcelino Duster Ronold Hardgrove FNP-C and Allean Found PA-C were not listed in receiving pools for [facility] prescription refills. This was discovered 01/19/18 and providers added to pool. This prescription has already been handled.

## 2018-01-23 ENCOUNTER — Encounter: Payer: Self-pay | Admitting: Internal Medicine

## 2018-01-23 DIAGNOSIS — R0609 Other forms of dyspnea: Secondary | ICD-10-CM | POA: Insufficient documentation

## 2018-01-23 DIAGNOSIS — H6122 Impacted cerumen, left ear: Secondary | ICD-10-CM | POA: Insufficient documentation

## 2018-01-23 NOTE — Assessment & Plan Note (Signed)
01/18/2018  Walked RA x 3 laps @ 185 ft each stopped due to  Sob fast   pace, no  desat    Will need to next explore sob p eliminate the cough  - strongly suspect this is a conditioning issue but note she does have RA and needs full pfts on mtx so will need to be eval with full pfts once we get her to stop coughing

## 2018-01-23 NOTE — Assessment & Plan Note (Addendum)
H/o acei cough 2013 - recurrent dry cough Nov 2018 / no better on advair dpi > d/c'd 01/18/2018  - cyclical cough rx 12/08/2017  > did not follow instructions, try again 01/18/2018 then return for med reconciliation   Upper airway cough syndrome (previously labeled PNDS),  is so named because it's frequently impossible to sort out how much is  CR/sinusitis with freq throat clearing (which can be related to primary GERD)   vs  causing  secondary (" extra esophageal")  GERD from wide swings in gastric pressure that occur with throat clearing, often  promoting self use of mint and menthol lozenges that reduce the lower esophageal sphincter tone and exacerbate the problem further in a cyclical fashion.   These are the same pts (now being labeled as having "irritable larynx syndrome" by some cough centers) who not infrequently have a history of having failed to tolerate ace inhibitors,  dry powder inhalers(especially ACEi) or biphosphonates or report having atypical/extraesophageal reflux symptoms that don't respond to standard doses of PPI  and are easily confused as having aecopd or asthma flares by even experienced allergists/ pulmonologists (myself included).      Of the three most common causes of  Sub-acute / recurrent or chronic cough, only one (GERD)  can actually contribute to/ trigger  the other two (asthma and post nasal drip syndrome)  and perpetuate the cylce of cough.  While not intuitively obvious, many patients with chronic low grade reflux do not cough until there is a primary insult that disturbs the protective epithelial barrier and exposes sensitive nerve endings.   This is typically viral but can due to PNDS and  either may apply here.     The point is that once this occurs, it is difficult to eliminate the cycle  using anything but a maximally effective acid suppression regimen at least in the short run, accompanied by an appropriate diet to address non acid GERD and control / eliminate  all pnds with 1st gen H1 blockers per guidelines  And also eliminate the cough itself for at least 3 days with tramadol  then return in 2 weeks for med reconciliation    To keep things simple, I have asked the patient to first separate medicines that are perceived as maintenance, that is to be taken daily "no matter what", from those medicines that are taken on only on an as-needed basis and I have given the patient examples of both, and then return to see our NP to generate a  detailed  medication calendar which should be followed until the next physician sees the patient and updates it.     I had an extended discussion with the patient reviewing all relevant studies completed to date and  lasting 15 to 20 minutes of a 25 minute visit    Each maintenance medication was reviewed in detail including most importantly the difference between maintenance and prns and under what circumstances the prns are to be triggered using an action plan format that is not reflected in the computer generated alphabetically organized AVS.    Please see AVS for specific instructions unique to this visit that I personally wrote and verbalized to the the pt in detail and then reviewed with pt  by my nurse highlighting any  changes in therapy recommended at today's visit to their plan of care.

## 2018-01-23 NOTE — Assessment & Plan Note (Signed)
Noted 6/191/9 > no improvement 01/18/2018   Wax impaction can cause cough if irritates the vagus nerve so rec again she use otc ear wax remover and return for lavage in 2 weeks

## 2018-02-10 ENCOUNTER — Ambulatory Visit: Payer: No Typology Code available for payment source | Admitting: Adult Health

## 2018-02-18 ENCOUNTER — Other Ambulatory Visit: Payer: Self-pay | Admitting: Family Medicine

## 2018-02-18 DIAGNOSIS — Z1231 Encounter for screening mammogram for malignant neoplasm of breast: Secondary | ICD-10-CM

## 2018-03-29 ENCOUNTER — Ambulatory Visit: Payer: No Typology Code available for payment source

## 2018-04-12 ENCOUNTER — Ambulatory Visit
Admission: RE | Admit: 2018-04-12 | Discharge: 2018-04-12 | Disposition: A | Discharge status: Home / Self Care | Payer: No Typology Code available for payment source | Source: Ambulatory Visit | Attending: Family Medicine | Admitting: Family Medicine

## 2018-04-12 DIAGNOSIS — Z1231 Encounter for screening mammogram for malignant neoplasm of breast: Secondary | ICD-10-CM

## 2018-12-12 ENCOUNTER — Encounter: Payer: Self-pay | Admitting: *Deleted

## 2018-12-12 ENCOUNTER — Other Ambulatory Visit: Payer: Self-pay | Admitting: Hematology

## 2018-12-12 ENCOUNTER — Telehealth: Payer: Self-pay | Admitting: *Deleted

## 2018-12-12 ENCOUNTER — Other Ambulatory Visit: Payer: No Typology Code available for payment source

## 2018-12-12 DIAGNOSIS — Z20822 Contact with and (suspected) exposure to covid-19: Secondary | ICD-10-CM

## 2018-12-12 NOTE — Progress Notes (Signed)
Re-ordered COVID screening. 

## 2018-12-12 NOTE — Telephone Encounter (Signed)
I attempted to send secure chat message as previously requested when calling about another pt, but the pool does not come up as an available contact for me.   Pt presented to work here at [employer] this morning and during daily screening, she endorsed runny nose, post nasal drip, sneezing since Thurs evening (6/18). She was afebrile at screening.  She works in a Physiological scientist. Has multiple risk factors for severe disease. Denies any known sick contacts. Discussed with pt that for these reasons, we would refer her for testing, and that [facility] would contact to discuss further and handle scheduling. Instructed to remain at home and quarantine until results are available and if positive, further instructions will be given. She is agreeable. She prefers the [place] drive thru site and is available the rest of the day. Phone [phone]. Thank you.

## 2018-12-12 NOTE — Telephone Encounter (Signed)
Spoke with patient- scheduled her for COVID 19 testing today at [facility] at 11:15 am.  Patient advised to stay in her car and wear a mask.

## 2018-12-15 ENCOUNTER — Encounter: Payer: Self-pay | Admitting: *Deleted

## 2018-12-17 LAB — NOVEL CORONAVIRUS, NAA: SARS-CoV-2, NAA: NOT DETECTED

## 2019-03-20 ENCOUNTER — Other Ambulatory Visit: Payer: Self-pay | Admitting: Family Medicine

## 2019-03-20 DIAGNOSIS — Z1231 Encounter for screening mammogram for malignant neoplasm of breast: Secondary | ICD-10-CM

## 2019-05-11 ENCOUNTER — Other Ambulatory Visit: Payer: Self-pay

## 2019-05-11 ENCOUNTER — Ambulatory Visit
Admission: RE | Admit: 2019-05-11 | Discharge: 2019-05-11 | Disposition: A | Discharge status: Home / Self Care | Payer: No Typology Code available for payment source | Source: Ambulatory Visit | Attending: Family Medicine | Admitting: Family Medicine

## 2019-05-11 DIAGNOSIS — Z1231 Encounter for screening mammogram for malignant neoplasm of breast: Secondary | ICD-10-CM

## 2019-07-28 ENCOUNTER — Telehealth: Payer: Self-pay | Admitting: Registered Nurse

## 2019-07-28 NOTE — Telephone Encounter (Signed)
Patient contacted via telephone stated diarrhea started yesterday, chronic cough, runny nose (3 days),  Denied sore throat, loss of taste/smell/nausea/vomiting/fever/chills/body aches/trouble breathing or wheezing.  She had a birthday party with family 07/23/2019 and her daughter in law tested positive for covid after the party/having symptoms.  Son negative test today.  She notified everyone at party they should be covid tested.  HR Tim contacted me today to help assist patient to schedule covid testing as she was instructed by employer to stay home and quarantine.  Patient denied fever on any employment prebuilding entry checks.  She has been fatigued today also took a nap this morning.  Continue quarantine at home and monitoring for symptoms of covid e.g. runny nose, sore throat, headache, loss of taste/smell, nausea/vomiting/diarrhea, fever/chills, body aches, cough, shortness of breath/dyspnea.   Patient to notify clinic staff if new symptoms develop.  Discussed RN Rolly Salter on vacation until Tuesday but can be reached at [phone] extension.  Clinic closed Wed/Sat/Sun.  She will be in clinic Tu, Th and Fr.  Quarantine to end 08/04/2019 and return to work 08/07/2019 as long as symptoms improving and fever free 24 hours prior to work.   Discussed with patient covid spread and incidence in community high.  Continue to protect self with mask wear, hand sanitizing washing, avoid touching face and maintaining social distancing 6 feet or greater. Patient scheduled for testing ambulatory [facility]: [address], [place] Thursday August 03, 2019 Arrive by 8:00 AM Starts at 8:00 AM (15 minutes)   PATIENT [facility] [phone]  This is a visit at the [facility] [address] [place]. Someone will direct you where to park. Stay in your car and someone will be with you shortly.  Patient email [email or web address]  Discussed with patient I will send her self care handout via  email or mychart after termination of call.  I can be reached via mychart or email [email or web address].  I will call her again Sunday.  Encouraged her to hydrate as diarrhea can dehydrate a person.  Denied bloody or black stools.  She felt a little woozy when she first stood up this morning.  Discussed confusion/syncope/blue lips or face, chest pain worsening, difficulty breathing worsening she should seek re-evaluation.  Patient verbalized understanding of information/instructions, agreed with plan of care and had no further questions at this time.

## 2019-08-01 MED ORDER — AMOXICILLIN-POT CLAVULANATE 875-125 MG PO TABS: 1.0000 | ORAL_TABLET | Freq: Two times a day (BID) | ORAL | 0 refills | Status: DC

## 2019-08-01 NOTE — Telephone Encounter (Addendum)
Patient contacted via telephone.  Stated cough chronic since 2019 has seen pulmonology gotten a little worse.  Milky white phlegm productive cough.  A little diarrhea once per day stools. + congestion nasal.  Denied loss of taste/smell, fever/chills, nausea/vomiting, sore throat or body aches.  Patient testing pending 11 Feb @ 0800 [facility] patient reminded.  Quarantining at home alone.  Denied blue lips/face, difficulty breathing or syncope.  Patient needs mychart password reset.  Assisted today.  Patient tolerating po intake without difficulty making changes in diet due to blood sugars elevated last PCM visit.  Patient to continue quarantine, go to schedule testing appt.  Notify us if new or worsening symptoms.  Will mail her pulse oximeter for home use once loaners returned. Medication reconciliation performed with patient and Augmentin 875mg  po BID x 10 days #20 RF0 ordered for patient to pick up at her pharmacy of choice to cover for bronchitis/pneumonia as sputum now milky cloudy. Patient verbalized understanding information/instructions, agreed with plan of care and had no further questions today.

## 2019-08-03 ENCOUNTER — Telehealth: Payer: Self-pay | Admitting: Registered Nurse

## 2019-08-03 ENCOUNTER — Ambulatory Visit: Payer: No Typology Code available for payment source | Attending: Internal Medicine

## 2019-08-03 DIAGNOSIS — Z20822 Contact with and (suspected) exposure to covid-19: Secondary | ICD-10-CM

## 2019-08-03 MED ORDER — AMOXICILLIN-POT CLAVULANATE 875-125 MG PO TABS: 1.0000 | ORAL_TABLET | Freq: Two times a day (BID) | ORAL | 0 refills | Status: AC

## 2019-08-03 NOTE — Telephone Encounter (Signed)
Patient reported she had covid test performed at 0800 this morning and their was a line.  Afterwards she attempted to pick up her augmentin but pharmacy had not received Rx.  Epic reported it was transmitted will resend again today for patient.  Notified her sp02 monitor mailed on Tuesday she did not receive in the mail today.  Still having white productive cough.  No cough heard during telephone conversation spoke full sentences without difficulty A&Ox3.  Patient reported diarrhea/stomach upset has resolved.  Reminded patient my chart will get test notification before me since I have to do chart review/not an immediate notification.  Patient otherwise feeling better today overall.  Denied concerns at this time and continuing quarantine at home/trying to stay active.  Discussed I or RN Rolly Salter will be calling with test results once available and to check on her this weekend.  Patient to contact her pharmacy regarding picking up augmentin as new rx transmitted while on phone with patient.  Patient verbalized understanding and had no further questions at this time.

## 2019-08-04 LAB — NOVEL CORONAVIRUS, NAA: SARS-CoV-2, NAA: NOT DETECTED

## 2019-08-04 NOTE — Telephone Encounter (Signed)
Spoke with pt over phone. Gave her negative covid test result. She reports that she was able to start Augmentin yesterday, 2/11 after she realized that she did pickup that med Wednesday evening (She thought it was levothyroxine that she had pending at pharmacy). She is 3 doses in on this. Sts her productive cough slightly improved with phlegm being a little less milky white in color and clearing up some. Reports decrease in nasal congestion and PND. Still with one episode of diarrhea/loose stool per day that she feels is more related to her "nerves/anxiety." She denies any new or worsening sx. She has not received pulse oximeter yet but is going to check mailbox when she arrives back home from pharmacy and expects it to be there today. Advised pt to send Mychart message or email clinic staff over the weekend if she has any questions/concerns/change in sx. Last day of quarantine for pt is today. As long as no changes and continues to have no fever for 24 hours prior to arrive, pt is scheduled to RTW on 08/07/19. No further questions/concerns today.

## 2019-08-04 NOTE — Telephone Encounter (Signed)
Noted cough improved on augmentin; sp02 monitor still has not received and covid test negative.  Last day of quarantine today.

## 2019-08-05 NOTE — Telephone Encounter (Signed)
Negative covid test results.  Quarantine ended yesterday.  Patient contacted via telephone and she stated cough improved today and no diarrhea/stomach upset.  On fourth day of augmentin.  Denied dyspnea/SOB/n/v/d/fever/chills/loss of taste or smell.  Plan to return to work Monday in 36 hours.  Still has not received sp02 monitor in the mail.  Her son going to check her mail today since ice on sidewalks/he hasn't come by yet today.  Patient had no further questions or concerns at this time.

## 2019-08-07 NOTE — Telephone Encounter (Signed)
Noted patient returned to work onsite today still awaiting sp02 monitor delivery via [employer].

## 2019-08-07 NOTE — Telephone Encounter (Signed)
Did patient return onsite as expected today?  She still hadn't received sp02 in mail this weekend.

## 2019-08-07 NOTE — Telephone Encounter (Signed)
Pt did return to work as expected today. Emailed pt apologizing for delay in O2 monitor receipt. Was mailed 6 days ago. Asked her to alert clinic when she receives monitor in mail. Also asked pt to confirm mailing address again.

## 2019-08-14 NOTE — Telephone Encounter (Signed)
Saw pt in clinic Friday 2/19. She brought pulse oximeter with her that reached her home by mail the day before. She asked for assistance in making sure she was using and reading it correctly. SpO2 96% with good waveform. Pt felt comfortable using at home. Advised if reading <90%, take a few deep breaths, use inhaler if needed and recheck. If remains <90%, seek same day care. Goal above 92%. She verbalizes understanding and agreement with plan of care. No further questions/concerns.

## 2019-08-15 NOTE — Telephone Encounter (Signed)
Noted sp02 stable RA will continue to monitor her sp02 at home.

## 2019-08-18 ENCOUNTER — Ambulatory Visit: Payer: No Typology Code available for payment source | Attending: Internal Medicine

## 2019-08-18 DIAGNOSIS — Z23 Encounter for immunization: Secondary | ICD-10-CM | POA: Insufficient documentation

## 2019-08-18 NOTE — Progress Notes (Signed)
   Covid-19 Vaccination Clinic  Name:  Kathy Robles    MRN: [id number] DOB: Oct 16, 1945  08/18/2019  [name] was observed post Covid-19 immunization for 15 minutes without incidence. She was provided with Vaccine Information Sheet and instruction to access the V-Safe system.   [name] was instructed to call 911 with any severe reactions post vaccine: Marland Kitchen Difficulty breathing  . Swelling of your face and throat  . A fast heartbeat  . A bad rash all over your body  . Dizziness and weakness    Immunizations Administered    Name Date Dose VIS Date Route   Pfizer COVID-19 Vaccine 08/18/2019  8:36 AM 0.3 mL 06/02/2019 Intramuscular   Manufacturer: [employer], [employer]   Lot: [id number]   NDC: [address]-0

## 2019-08-21 NOTE — Telephone Encounter (Signed)
Noted patient phone line busy

## 2019-08-21 NOTE — Telephone Encounter (Signed)
RN unable to reach pt by phone today. Line always busy. Sent email to pt this morning requesting pt f/u with how she is feeling, sx status, and recent oxygen levels. Phone still busy now as well. Awaiting response.

## 2019-08-24 NOTE — Telephone Encounter (Signed)
Please follow up with patient.

## 2019-08-25 ENCOUNTER — Telehealth: Payer: Self-pay | Admitting: *Deleted

## 2019-08-25 MED ORDER — ALBUTEROL SULFATE 108 (90 BASE) MCG/ACT IN AEPB: 1.0000 | INHALATION_SPRAY | RESPIRATORY_TRACT | 1 refills | Status: AC | PRN

## 2019-08-25 NOTE — Telephone Encounter (Signed)
Pt still using albuterol inhaler prn. ?Has only 2 puffs left per counter. Originally prescribed by Kennon Rounds, but she has not received a call back from office and requests refill. Order placed to pharmacy of choice.

## 2019-08-25 NOTE — Telephone Encounter (Signed)
Pt responded by email on Monday 08/21/19. She reports receiving her first covid vaccine over the previous weekend. She had some arm soreness, dizziness, and HA but was feeling better within 24 hours. Sts her cough is still present but improving, nonproductive and hacky. Has been using Albuterol inhaler prn. SpO2 readings have been in mid 90's. Denies any current needs or concerns.

## 2019-08-27 ENCOUNTER — Encounter: Payer: Self-pay | Admitting: *Deleted

## 2019-08-27 NOTE — Telephone Encounter (Signed)
Noted Rx signed for albuterol refill

## 2019-08-27 NOTE — Telephone Encounter (Signed)
Noted covid vaccination received some expected side effects noted

## 2019-09-12 ENCOUNTER — Ambulatory Visit: Payer: No Typology Code available for payment source | Attending: Internal Medicine

## 2019-09-12 DIAGNOSIS — Z23 Encounter for immunization: Secondary | ICD-10-CM

## 2019-09-12 NOTE — Progress Notes (Signed)
   Covid-19 Vaccination Clinic  Name:  Kathy Robles    MRN: [id number] DOB: 1945/07/28  09/12/2019  [name] was observed post Covid-19 immunization for 15 minutes without incident. She was provided with Vaccine Information Sheet and instruction to access the V-Safe system.   [name] was instructed to call 911 with any severe reactions post vaccine: Marland Kitchen Difficulty breathing  . Swelling of face and throat  . A fast heartbeat  . A bad rash all over body  . Dizziness and weakness   Immunizations Administered    Name Date Dose VIS Date Route   Pfizer COVID-19 Vaccine 09/12/2019 10:03 AM 0.3 mL 06/02/2019 Intramuscular   Manufacturer: [employer], [employer]   Lot: [id number]   NDC: [address]-5

## 2020-04-09 ENCOUNTER — Other Ambulatory Visit: Payer: Self-pay | Admitting: Family Medicine

## 2020-04-09 DIAGNOSIS — Z1231 Encounter for screening mammogram for malignant neoplasm of breast: Secondary | ICD-10-CM

## 2020-05-13 ENCOUNTER — Ambulatory Visit
Admission: RE | Admit: 2020-05-13 | Discharge: 2020-05-13 | Disposition: A | Discharge status: Home / Self Care | Payer: No Typology Code available for payment source | Source: Ambulatory Visit | Attending: Family Medicine | Admitting: Family Medicine

## 2020-05-13 ENCOUNTER — Other Ambulatory Visit: Payer: Self-pay

## 2020-05-13 DIAGNOSIS — Z1231 Encounter for screening mammogram for malignant neoplasm of breast: Secondary | ICD-10-CM

## 2021-01-17 ENCOUNTER — Ambulatory Visit: Payer: Self-pay | Admitting: *Deleted

## 2021-01-17 ENCOUNTER — Other Ambulatory Visit: Payer: Self-pay

## 2021-01-17 VITALS — BP 146/81 | HR 58 | Ht 59.0 in | Wt 162.0 lb

## 2021-01-17 DIAGNOSIS — E118 Type 2 diabetes mellitus with unspecified complications: Secondary | ICD-10-CM

## 2021-01-17 DIAGNOSIS — R7989 Other specified abnormal findings of blood chemistry: Secondary | ICD-10-CM

## 2021-01-17 DIAGNOSIS — Z794 Long term (current) use of insulin: Secondary | ICD-10-CM

## 2021-01-17 DIAGNOSIS — Z0000 Encounter for general adult medical examination without abnormal findings: Secondary | ICD-10-CM

## 2021-01-17 NOTE — Progress Notes (Signed)
[employer] insurance premium discount evaluation: Labs Drawn. [employer] ROI form signed. Tobacco Free Attestation form signed.  Forms placed in paper chart.   Reports BP 0600 today at home was 114/71. Sts white coat syndrome with any medical provider office.   Route results to Dr. Talmage Nap and pcp. Appt with [facility] Monday 8/1.

## 2021-01-18 LAB — CMP12+LP+TP+TSH+6AC+CBC/D/PLT
ALT: 12 IU/L (ref 0–32)
AST: 16 IU/L (ref 0–40)
Albumin/Globulin Ratio: 1.8 (ref 1.2–2.2)
Albumin: 4.3 g/dL (ref 3.7–4.7)
Alkaline Phosphatase: 82 IU/L (ref 44–121)
BUN/Creatinine Ratio: 23 (ref 12–28)
BUN: 23 mg/dL (ref 8–27)
Basophils Absolute: 0 10*3/uL (ref 0.0–0.2)
Basos: 1 %
Bilirubin Total: 0.5 mg/dL (ref 0.0–1.2)
Calcium: 9.3 mg/dL (ref 8.7–10.3)
Chloride: 100 mmol/L (ref 96–106)
Chol/HDL Ratio: 5.1 ratio — ABNORMAL HIGH (ref 0.0–4.4)
Cholesterol, Total: 254 mg/dL — ABNORMAL HIGH (ref 100–199)
Creatinine, Ser: 1 mg/dL (ref 0.57–1.00)
EOS (ABSOLUTE): 0.2 10*3/uL (ref 0.0–0.4)
Eos: 3 %
Estimated CHD Risk: 1.3 times avg. — ABNORMAL HIGH (ref 0.0–1.0)
Free Thyroxine Index: 2.7 (ref 1.2–4.9)
GGT: 19 IU/L (ref 0–60)
Globulin, Total: 2.4 g/dL (ref 1.5–4.5)
Glucose: 242 mg/dL — ABNORMAL HIGH (ref 65–99)
HDL: 50 mg/dL (ref >39)
Hematocrit: 44.6 % (ref 34.0–46.6)
Hemoglobin: 14.7 g/dL (ref 11.1–15.9)
Immature Grans (Abs): 0 10*3/uL (ref 0.0–0.1)
Immature Granulocytes: 1 %
Iron: 125 ug/dL (ref 27–139)
LDH: 231 IU/L — ABNORMAL HIGH (ref 119–226)
LDL Chol Calc (NIH): 170 mg/dL — ABNORMAL HIGH (ref 0–99)
Lymphocytes Absolute: 1.3 10*3/uL (ref 0.7–3.1)
Lymphs: 20 %
MCH: 29.3 pg (ref 26.6–33.0)
MCHC: 33 g/dL (ref 31.5–35.7)
MCV: 89 fL (ref 79–97)
Monocytes Absolute: 0.4 10*3/uL (ref 0.1–0.9)
Monocytes: 7 %
Neutrophils Absolute: 4.5 10*3/uL (ref 1.4–7.0)
Neutrophils: 68 %
Phosphorus: 3.5 mg/dL (ref 3.0–4.3)
Platelets: 203 10*3/uL (ref 150–450)
Potassium: 4.7 mmol/L (ref 3.5–5.2)
RBC: 5.02 x10E6/uL (ref 3.77–5.28)
RDW: 12.8 % (ref 11.7–15.4)
Sodium: 138 mmol/L (ref 134–144)
T3 Uptake Ratio: 29 % (ref 24–39)
T4, Total: 9.4 ug/dL (ref 4.5–12.0)
TSH: 1.63 u[IU]/mL (ref 0.450–4.500)
Total Protein: 6.7 g/dL (ref 6.0–8.5)
Triglycerides: 182 mg/dL — ABNORMAL HIGH (ref 0–149)
Uric Acid: 7.5 mg/dL (ref 3.1–7.9)
VLDL Cholesterol Cal: 34 mg/dL (ref 5–40)
WBC: 6.4 10*3/uL (ref 3.4–10.8)
eGFR: 59 mL/min/{1.73_m2} — ABNORMAL LOW (ref >59)

## 2021-01-18 LAB — HGB A1C W/O EAG: Hgb A1c MFr Bld: 10.2 % — ABNORMAL HIGH (ref 4.8–5.6)

## 2021-01-18 LAB — VITAMIN D 25 HYDROXY (VIT D DEFICIENCY, FRACTURES): Vit D, 25-Hydroxy: 23.2 ng/mL — ABNORMAL LOW (ref 30.0–100.0)

## 2021-01-19 ENCOUNTER — Encounter: Payer: Self-pay | Admitting: Registered Nurse

## 2021-01-19 DIAGNOSIS — Z6832 Body mass index (BMI) 32.0-32.9, adult: Secondary | ICD-10-CM

## 2021-01-19 DIAGNOSIS — I10 Essential (primary) hypertension: Secondary | ICD-10-CM

## 2021-01-19 DIAGNOSIS — R7989 Other specified abnormal findings of blood chemistry: Secondary | ICD-10-CM

## 2021-01-19 DIAGNOSIS — Z794 Long term (current) use of insulin: Secondary | ICD-10-CM

## 2021-01-19 NOTE — Addendum Note (Signed)
Addended by: Albina Billet A on: 01/19/2021 03:13 PM   Modules accepted: Orders

## 2021-01-24 DIAGNOSIS — E1165 Type 2 diabetes mellitus with hyperglycemia: Secondary | ICD-10-CM | POA: Insufficient documentation

## 2021-01-24 DIAGNOSIS — E039 Hypothyroidism, unspecified: Secondary | ICD-10-CM | POA: Insufficient documentation

## 2021-01-24 DIAGNOSIS — G609 Hereditary and idiopathic neuropathy, unspecified: Secondary | ICD-10-CM | POA: Insufficient documentation

## 2021-01-24 DIAGNOSIS — E11319 Type 2 diabetes mellitus with unspecified diabetic retinopathy without macular edema: Secondary | ICD-10-CM | POA: Insufficient documentation

## 2021-01-24 DIAGNOSIS — M069 Rheumatoid arthritis, unspecified: Secondary | ICD-10-CM | POA: Insufficient documentation

## 2021-01-24 DIAGNOSIS — I10 Essential (primary) hypertension: Secondary | ICD-10-CM | POA: Insufficient documentation

## 2021-01-24 DIAGNOSIS — E7800 Pure hypercholesterolemia, unspecified: Secondary | ICD-10-CM | POA: Insufficient documentation

## 2021-01-24 NOTE — Telephone Encounter (Signed)
Patient requested referral to [facility] with RN Rolly Salter today referral entered BMI 32, worsening Hgba1c, hypertension, hypothyroidism, chronic kidney disease and low vitamin D.  Continue follow up with endocrinology

## 2021-01-24 NOTE — Progress Notes (Signed)
Spoke with pt by phone. Reviewed results and NP notes as above. She is taking 1000u Vit D daily. She will increase to 2000u. She reports 70/30 Humalog was recently adjusted and she is taking TID now and sending weekly cbg readings to endocrinologist for close f/u. She is already on a statin and sees pcp next week and will discuss lipid levels with them.  Sts she does not have a dentist as she has dentures but does see a retinal specialist for eye care.  She would like a referral to [facility].  Appt made for repeat labs for 04/14/21.

## 2021-01-24 NOTE — Progress Notes (Signed)
Noted endocrinology has adjusted insulin dosing, has not seen dentist as dentures not bothering her but has had eye exam.  Will increase Vitamin D to 2000 international units and requests referral to [facility]. Entered today.

## 2021-04-10 ENCOUNTER — Other Ambulatory Visit: Payer: Self-pay | Admitting: Family Medicine

## 2021-04-10 DIAGNOSIS — Z1231 Encounter for screening mammogram for malignant neoplasm of breast: Secondary | ICD-10-CM

## 2021-05-14 ENCOUNTER — Other Ambulatory Visit: Payer: Self-pay

## 2021-05-14 ENCOUNTER — Ambulatory Visit
Admission: RE | Admit: 2021-05-14 | Discharge: 2021-05-14 | Disposition: A | Discharge status: Home / Self Care | Payer: HMO | Source: Ambulatory Visit | Attending: Family Medicine | Admitting: Family Medicine

## 2021-05-14 DIAGNOSIS — Z1231 Encounter for screening mammogram for malignant neoplasm of breast: Secondary | ICD-10-CM

## 2021-05-22 ENCOUNTER — Telehealth: Payer: Self-pay | Admitting: Registered Nurse

## 2021-05-22 ENCOUNTER — Encounter: Payer: Self-pay | Admitting: Registered Nurse

## 2021-05-22 NOTE — Telephone Encounter (Signed)
Patient contacted via telephone and notified I received Epic reminder due microalbumin, Hgba1c and Vitamin D rechecks.  Patient instructed to discuss with her PCM since she retired and no longer eligible for labs/care at [employer].  Patient stated she has doctor's appt today and will discuss with her provider.  Patient verbalized understanding information/instructions, agreed with plan of care and had no further questions at this time.

## 2021-08-12 DIAGNOSIS — G629 Polyneuropathy, unspecified: Secondary | ICD-10-CM | POA: Diagnosis not present

## 2021-08-12 DIAGNOSIS — E785 Hyperlipidemia, unspecified: Secondary | ICD-10-CM | POA: Diagnosis not present

## 2021-08-12 DIAGNOSIS — E669 Obesity, unspecified: Secondary | ICD-10-CM | POA: Diagnosis not present

## 2021-08-12 DIAGNOSIS — G8929 Other chronic pain: Secondary | ICD-10-CM | POA: Diagnosis not present

## 2021-08-12 DIAGNOSIS — M1990 Unspecified osteoarthritis, unspecified site: Secondary | ICD-10-CM | POA: Diagnosis not present

## 2021-08-12 DIAGNOSIS — Z794 Long term (current) use of insulin: Secondary | ICD-10-CM | POA: Diagnosis not present

## 2021-08-12 DIAGNOSIS — M0550 Rheumatoid polyneuropathy with rheumatoid arthritis of unspecified site: Secondary | ICD-10-CM | POA: Diagnosis not present

## 2021-08-12 DIAGNOSIS — I10 Essential (primary) hypertension: Secondary | ICD-10-CM | POA: Diagnosis not present

## 2021-08-12 DIAGNOSIS — E039 Hypothyroidism, unspecified: Secondary | ICD-10-CM | POA: Diagnosis not present

## 2021-08-12 DIAGNOSIS — E1169 Type 2 diabetes mellitus with other specified complication: Secondary | ICD-10-CM | POA: Diagnosis not present

## 2021-08-12 DIAGNOSIS — Z7982 Long term (current) use of aspirin: Secondary | ICD-10-CM | POA: Diagnosis not present

## 2021-08-12 DIAGNOSIS — E1142 Type 2 diabetes mellitus with diabetic polyneuropathy: Secondary | ICD-10-CM | POA: Diagnosis not present

## 2021-08-27 DIAGNOSIS — Z79899 Other long term (current) drug therapy: Secondary | ICD-10-CM | POA: Diagnosis not present

## 2021-08-27 DIAGNOSIS — I10 Essential (primary) hypertension: Secondary | ICD-10-CM | POA: Diagnosis not present

## 2021-08-27 DIAGNOSIS — E11319 Type 2 diabetes mellitus with unspecified diabetic retinopathy without macular edema: Secondary | ICD-10-CM | POA: Diagnosis not present

## 2021-08-27 DIAGNOSIS — E7800 Pure hypercholesterolemia, unspecified: Secondary | ICD-10-CM | POA: Diagnosis not present

## 2021-08-27 DIAGNOSIS — E039 Hypothyroidism, unspecified: Secondary | ICD-10-CM | POA: Diagnosis not present

## 2021-08-27 DIAGNOSIS — G609 Hereditary and idiopathic neuropathy, unspecified: Secondary | ICD-10-CM | POA: Diagnosis not present

## 2021-08-27 DIAGNOSIS — E1165 Type 2 diabetes mellitus with hyperglycemia: Secondary | ICD-10-CM | POA: Diagnosis not present

## 2021-09-08 ENCOUNTER — Other Ambulatory Visit: Payer: Self-pay | Admitting: Family Medicine

## 2021-09-08 DIAGNOSIS — E2839 Other primary ovarian failure: Secondary | ICD-10-CM

## 2021-09-08 DIAGNOSIS — I129 Hypertensive chronic kidney disease with stage 1 through stage 4 chronic kidney disease, or unspecified chronic kidney disease: Secondary | ICD-10-CM | POA: Diagnosis not present

## 2021-09-08 DIAGNOSIS — E113593 Type 2 diabetes mellitus with proliferative diabetic retinopathy without macular edema, bilateral: Secondary | ICD-10-CM | POA: Diagnosis not present

## 2021-09-08 DIAGNOSIS — E7800 Pure hypercholesterolemia, unspecified: Secondary | ICD-10-CM | POA: Diagnosis not present

## 2021-09-08 DIAGNOSIS — N183 Chronic kidney disease, stage 3 unspecified: Secondary | ICD-10-CM | POA: Diagnosis not present

## 2021-09-12 ENCOUNTER — Ambulatory Visit
Admission: RE | Admit: 2021-09-12 | Discharge: 2021-09-12 | Disposition: A | Discharge status: Home / Self Care | Payer: HMO | Source: Ambulatory Visit | Attending: Family Medicine | Admitting: Family Medicine

## 2021-09-12 DIAGNOSIS — M8589 Other specified disorders of bone density and structure, multiple sites: Secondary | ICD-10-CM | POA: Diagnosis not present

## 2021-09-12 DIAGNOSIS — E2839 Other primary ovarian failure: Secondary | ICD-10-CM

## 2021-09-12 DIAGNOSIS — Z780 Asymptomatic menopausal state: Secondary | ICD-10-CM | POA: Diagnosis not present

## 2021-10-30 ENCOUNTER — Ambulatory Visit
Admission: EM | Admit: 2021-10-30 | Discharge: 2021-10-30 | Disposition: A | Discharge status: Home / Self Care | Payer: HMO

## 2021-10-30 NOTE — ED Provider Notes (Signed)
Patient presents to urgent care today complaining of a sore right index finger nail that she believes may have detached.  Patient currently has an artificial nail glued to it.  Patient advised that fortunately, I am unable to fully assess the nail because of the artificial nail present.  Patient states that the nail is not been bleeding and has been sore for several days.  Patient states she initially went to her technologist she was hesitant to remove it because has been sore.  Patient advised that I recommend she go back to the nail technologist and have them remove the nail and trim any excess that is not attached.  Patient further advised that if there appears to be infection present after this has been performed, she is welcome to return for further evaluation.  Patient verbalized understanding and agreement with plan.  Patient advised that she will not be charged for this visit. ?  ?Lynden Oxford Scales, PA-C ?10/30/21 1400 ? ?

## 2021-10-30 NOTE — ED Triage Notes (Signed)
Pt states rt thumb nail possible infection.  ?

## 2021-11-05 DIAGNOSIS — L03011 Cellulitis of right finger: Secondary | ICD-10-CM | POA: Diagnosis not present

## 2021-11-05 DIAGNOSIS — E162 Hypoglycemia, unspecified: Secondary | ICD-10-CM | POA: Diagnosis not present

## 2022-01-13 DIAGNOSIS — B351 Tinea unguium: Secondary | ICD-10-CM | POA: Diagnosis not present

## 2022-02-02 DIAGNOSIS — E1165 Type 2 diabetes mellitus with hyperglycemia: Secondary | ICD-10-CM | POA: Diagnosis not present

## 2022-02-02 DIAGNOSIS — E7800 Pure hypercholesterolemia, unspecified: Secondary | ICD-10-CM | POA: Diagnosis not present

## 2022-02-02 DIAGNOSIS — E039 Hypothyroidism, unspecified: Secondary | ICD-10-CM | POA: Diagnosis not present

## 2022-02-02 DIAGNOSIS — G609 Hereditary and idiopathic neuropathy, unspecified: Secondary | ICD-10-CM | POA: Diagnosis not present

## 2022-02-09 DIAGNOSIS — R052 Subacute cough: Secondary | ICD-10-CM | POA: Diagnosis not present

## 2022-02-09 DIAGNOSIS — J309 Allergic rhinitis, unspecified: Secondary | ICD-10-CM | POA: Diagnosis not present

## 2022-02-10 DIAGNOSIS — E1165 Type 2 diabetes mellitus with hyperglycemia: Secondary | ICD-10-CM | POA: Diagnosis not present

## 2022-02-10 DIAGNOSIS — G609 Hereditary and idiopathic neuropathy, unspecified: Secondary | ICD-10-CM | POA: Diagnosis not present

## 2022-02-10 DIAGNOSIS — E039 Hypothyroidism, unspecified: Secondary | ICD-10-CM | POA: Diagnosis not present

## 2022-02-10 DIAGNOSIS — E7800 Pure hypercholesterolemia, unspecified: Secondary | ICD-10-CM | POA: Diagnosis not present

## 2022-02-10 DIAGNOSIS — E11319 Type 2 diabetes mellitus with unspecified diabetic retinopathy without macular edema: Secondary | ICD-10-CM | POA: Diagnosis not present

## 2022-02-10 DIAGNOSIS — I10 Essential (primary) hypertension: Secondary | ICD-10-CM | POA: Diagnosis not present

## 2022-03-10 DIAGNOSIS — E039 Hypothyroidism, unspecified: Secondary | ICD-10-CM | POA: Diagnosis not present

## 2022-03-10 DIAGNOSIS — I10 Essential (primary) hypertension: Secondary | ICD-10-CM | POA: Diagnosis not present

## 2022-03-10 DIAGNOSIS — E7800 Pure hypercholesterolemia, unspecified: Secondary | ICD-10-CM | POA: Diagnosis not present

## 2022-03-10 DIAGNOSIS — E1165 Type 2 diabetes mellitus with hyperglycemia: Secondary | ICD-10-CM | POA: Diagnosis not present

## 2022-03-10 DIAGNOSIS — E11319 Type 2 diabetes mellitus with unspecified diabetic retinopathy without macular edema: Secondary | ICD-10-CM | POA: Diagnosis not present

## 2022-03-12 DIAGNOSIS — M791 Myalgia, unspecified site: Secondary | ICD-10-CM | POA: Diagnosis not present

## 2022-03-12 DIAGNOSIS — M7918 Myalgia, other site: Secondary | ICD-10-CM | POA: Diagnosis not present

## 2022-03-12 DIAGNOSIS — M542 Cervicalgia: Secondary | ICD-10-CM | POA: Diagnosis not present

## 2022-04-11 ENCOUNTER — Ambulatory Visit
Admission: EM | Admit: 2022-04-11 | Discharge: 2022-04-11 | Disposition: A | Discharge status: Home / Self Care | Payer: HMO

## 2022-04-11 NOTE — ED Notes (Signed)
Patient is being discharged from the Urgent Care and sent to the Emergency Department via POV. Per L. Morgan-Scales PA-C, patient is in need of higher level of care due to need for further evaluation . Patient is aware and verbalizes understanding of plan of care.  Vitals:   04/11/22 1219 04/11/22 1220  BP: (!) 182/75 (!) 170/80  Pulse: 79   Temp: 98.2 F (36.8 C)   SpO2: 95%

## 2022-04-11 NOTE — ED Triage Notes (Signed)
Patient states yesterday she began having a sharp pain to her right lung that worsens with standing and sitting. The patient patient states she did have episodes of SOB. Patient does not display physical signs/ symptoms of respiratory distress at this time.

## 2022-04-13 ENCOUNTER — Other Ambulatory Visit: Payer: Self-pay | Admitting: Family Medicine

## 2022-04-13 DIAGNOSIS — Z1231 Encounter for screening mammogram for malignant neoplasm of breast: Secondary | ICD-10-CM

## 2022-06-04 ENCOUNTER — Ambulatory Visit: Payer: HMO

## 2022-06-05 ENCOUNTER — Ambulatory Visit
Admission: RE | Admit: 2022-06-05 | Discharge: 2022-06-05 | Disposition: A | Discharge status: Home / Self Care | Payer: HMO | Source: Ambulatory Visit | Attending: Family Medicine | Admitting: Family Medicine

## 2022-06-05 DIAGNOSIS — Z1231 Encounter for screening mammogram for malignant neoplasm of breast: Secondary | ICD-10-CM

## 2022-07-29 ENCOUNTER — Other Ambulatory Visit (HOSPITAL_COMMUNITY): Payer: Self-pay

## 2022-08-25 DIAGNOSIS — H3582 Retinal ischemia: Secondary | ICD-10-CM | POA: Diagnosis not present

## 2022-08-25 DIAGNOSIS — H43812 Vitreous degeneration, left eye: Secondary | ICD-10-CM | POA: Diagnosis not present

## 2022-08-25 DIAGNOSIS — H35371 Puckering of macula, right eye: Secondary | ICD-10-CM | POA: Diagnosis not present

## 2022-08-25 DIAGNOSIS — E113593 Type 2 diabetes mellitus with proliferative diabetic retinopathy without macular edema, bilateral: Secondary | ICD-10-CM | POA: Diagnosis not present

## 2022-08-31 DIAGNOSIS — M0609 Rheumatoid arthritis without rheumatoid factor, multiple sites: Secondary | ICD-10-CM | POA: Diagnosis not present

## 2022-08-31 DIAGNOSIS — E7800 Pure hypercholesterolemia, unspecified: Secondary | ICD-10-CM | POA: Diagnosis not present

## 2022-08-31 DIAGNOSIS — E1165 Type 2 diabetes mellitus with hyperglycemia: Secondary | ICD-10-CM | POA: Diagnosis not present

## 2022-08-31 DIAGNOSIS — F3340 Major depressive disorder, recurrent, in remission, unspecified: Secondary | ICD-10-CM | POA: Diagnosis not present

## 2022-08-31 DIAGNOSIS — E039 Hypothyroidism, unspecified: Secondary | ICD-10-CM | POA: Diagnosis not present

## 2022-08-31 DIAGNOSIS — Z79899 Other long term (current) drug therapy: Secondary | ICD-10-CM | POA: Diagnosis not present

## 2022-08-31 DIAGNOSIS — Z1159 Encounter for screening for other viral diseases: Secondary | ICD-10-CM | POA: Diagnosis not present

## 2022-08-31 DIAGNOSIS — Z0000 Encounter for general adult medical examination without abnormal findings: Secondary | ICD-10-CM | POA: Diagnosis not present

## 2022-08-31 DIAGNOSIS — I10 Essential (primary) hypertension: Secondary | ICD-10-CM | POA: Diagnosis not present

## 2022-08-31 DIAGNOSIS — N183 Chronic kidney disease, stage 3 unspecified: Secondary | ICD-10-CM | POA: Diagnosis not present

## 2022-09-21 DIAGNOSIS — E11319 Type 2 diabetes mellitus with unspecified diabetic retinopathy without macular edema: Secondary | ICD-10-CM | POA: Diagnosis not present

## 2022-09-21 DIAGNOSIS — I10 Essential (primary) hypertension: Secondary | ICD-10-CM | POA: Diagnosis not present

## 2022-09-21 DIAGNOSIS — E039 Hypothyroidism, unspecified: Secondary | ICD-10-CM | POA: Diagnosis not present

## 2022-09-21 DIAGNOSIS — E1165 Type 2 diabetes mellitus with hyperglycemia: Secondary | ICD-10-CM | POA: Diagnosis not present

## 2022-09-21 DIAGNOSIS — E1140 Type 2 diabetes mellitus with diabetic neuropathy, unspecified: Secondary | ICD-10-CM | POA: Diagnosis not present

## 2022-09-21 DIAGNOSIS — E7800 Pure hypercholesterolemia, unspecified: Secondary | ICD-10-CM | POA: Diagnosis not present

## 2022-10-05 ENCOUNTER — Ambulatory Visit (INDEPENDENT_AMBULATORY_CARE_PROVIDER_SITE_OTHER): Payer: PPO | Admitting: Podiatry

## 2022-10-05 ENCOUNTER — Encounter: Payer: Self-pay | Admitting: Podiatry

## 2022-10-05 DIAGNOSIS — E1165 Type 2 diabetes mellitus with hyperglycemia: Secondary | ICD-10-CM | POA: Diagnosis not present

## 2022-10-05 DIAGNOSIS — B351 Tinea unguium: Secondary | ICD-10-CM | POA: Diagnosis not present

## 2022-10-05 DIAGNOSIS — M79674 Pain in right toe(s): Secondary | ICD-10-CM

## 2022-10-05 DIAGNOSIS — I739 Peripheral vascular disease, unspecified: Secondary | ICD-10-CM | POA: Diagnosis not present

## 2022-10-05 DIAGNOSIS — M79675 Pain in left toe(s): Secondary | ICD-10-CM | POA: Diagnosis not present

## 2022-10-05 NOTE — Progress Notes (Signed)
  Subjective:  Patient ID: CHIZARA WOEHRLE, female    DOB: 06/25/1945,   MRN: [id number]  Chief Complaint  Patient presents with   Diabetes    Nail trim     77 y.o. female presents for concern of thickened elongated and painful nails that are difficult to trim. Requesting to have them trimmed today. Relates burning and tingling in their feet. Patient is diabetic and last A1c was  Lab Results  Component Value Date   HGBA1C 10.2 (H) 01/17/2021   .   PCP:  Mila Palmer, MD    . Denies any other pedal complaints. Denies n/v/f/c.   Past Medical History:  Diagnosis Date   Cataract    Coronary artery disease    Diabetes mellitus    Hyperlipemia    Hypertension    Migraine    Neuropathy    Peritonitis (HCC)    Pulmonary embolus (HCC)    Rheumatoid arthritis (HCC)     Objective:  Physical Exam: Vascular: DP/PT pulses 2/4 bilateral. CFT <3 seconds. Absent hair growth on digits. Edema noted to bilateral lower extremities. Xerosis noted bilaterally.  Skin. No lacerations or abrasions bilateral feet. Nails 1-5 bilateral  are thickened discolored and elongated with subungual debris.  Musculoskeletal: MMT 5/5 bilateral lower extremities in DF, PF, Inversion and Eversion. Deceased ROM in DF of ankle joint.  Neurological: Sensation intact to light touch. Protective sensation diminished bilateral.    Assessment:   1. Pain due to onychomycosis of toenails of both feet   2. Peripheral vascular disease, unspecified   3. Type 2 diabetes mellitus with hyperglycemia, unspecified whether long term insulin use      Plan:  Patient was evaluated and treated and all questions answered. -Discussed and educated patient on diabetic foot care, especially with  regards to the vascular, neurological and musculoskeletal systems.  -Stressed the importance of good glycemic control and the detriment of not  controlling glucose levels in relation to the foot. -Discussed supportive shoes at all times  and checking feet regularly.  -Mechanically debrided all nails 1-5 bilateral using sterile nail nipper and filed with dremel without incident  -Answered all patient questions -Patient to return  in 3 months for at risk foot care -Patient advised to call the office if any problems or questions arise in the meantime.   Louann Sjogren, DPM

## 2022-10-30 ENCOUNTER — Ambulatory Visit
Admission: EM | Admit: 2022-10-30 | Discharge: 2022-10-30 | Disposition: A | Discharge status: Home / Self Care | Payer: PPO | Attending: Urgent Care | Admitting: Urgent Care

## 2022-10-30 DIAGNOSIS — E119 Type 2 diabetes mellitus without complications: Secondary | ICD-10-CM

## 2022-10-30 DIAGNOSIS — Z794 Long term (current) use of insulin: Secondary | ICD-10-CM | POA: Diagnosis not present

## 2022-10-30 DIAGNOSIS — L209 Atopic dermatitis, unspecified: Secondary | ICD-10-CM

## 2022-10-30 MED ORDER — TRIAMCINOLONE ACETONIDE 0.1 % EX CREA: 1.0000 | TOPICAL_CREAM | Freq: Two times a day (BID) | CUTANEOUS | 0 refills | Status: AC

## 2022-10-30 NOTE — ED Provider Notes (Signed)
[facility] - URG[facility]ARE [facility]  Note:  This document was prepared using Conservation officer, historic buildings and may include unintentional dictation errors.  MRN: [id number] DOB: April 19, 1946  Subjective:   Kathy Robles is a 77 y.o. female presenting for 3 day history of pruritic rash to the back of the neck and head, significant pruritus. Last month her A1c was 7%. No drainage of pus, bleeding, tenderness, weeping, plaques. Denies eating any new foods, starting new medications, exposure to poisonous plants, new hygiene products, new cleaning products or detergents.   No current facility-administered medications for this encounter.  Current Outpatient Medications:    Albuterol Sulfate 108 (90 Base) MCG/ACT AEPB, Inhale 1-2 puffs into the lungs every 4 (four) hours as needed. (Patient not taking: Reported on 01/17/2021), Disp: 1 each, Rfl: 1   aspirin EC 81 MG tablet, Take 81 mg by mouth daily., Disp: , Rfl:    b complex vitamins tablet, Take 1 tablet by mouth daily., Disp: , Rfl:    calcium carbonate (OSCAL) 1500 (600 Ca) MG TABS tablet, Take 600 mg of elemental calcium by mouth daily with breakfast., Disp: , Rfl:    cholecalciferol (VITAMIN D) 1000 units tablet, Take 1,000 Units by mouth daily., Disp: , Rfl:    cloNIDine (CATAPRES) 0.2 MG tablet, Take 1 tablet by mouth daily., Disp: , Rfl:    diclofenac sodium (VOLTAREN) 1 % GEL, Apply 4 g topically 4 (four) times daily as needed., Disp: , Rfl:    diphenhydramine-acetaminophen (TYLENOL PM) 25-500 MG TABS tablet, Take 1 tablet by mouth at bedtime as needed., Disp: , Rfl:    HUMALOG MIX 75/25 KWIKPEN (75-25) 100 UNIT/ML Kwikpen, 40 units am; 50 units pm SQ, Disp: , Rfl:    hydrochlorothiazide (HYDRODIURIL) 12.5 MG tablet, Take 1 tablet by mouth daily., Disp: , Rfl:    levothyroxine (SYNTHROID, LEVOTHROID) 25 MCG tablet, , Disp: , Rfl:    pregabalin (LYRICA) 25 MG capsule, Take 25 mg by mouth daily., Disp: , Rfl:    simvastatin (ZOCOR) 40  MG tablet, Take 40 mg by mouth every evening., Disp: , Rfl:    Allergies  Allergen Reactions   Coumadin [Warfarin Sodium] Hives   Insulin Lispro Other (See Comments)   Lisinopril Other (See Comments)   Ramipril Cough   Warfarin Hives    Past Medical History:  Diagnosis Date   Cataract    Coronary artery disease    Diabetes mellitus    Hyperlipemia    Hypertension    Migraine    Neuropathy    Peritonitis (HCC)    Pulmonary embolus (HCC)    Rheumatoid arthritis (HCC)      Past Surgical History:  Procedure Laterality Date   ABDOMINAL EXPLORATION SURGERY     ABDOMINAL HYSTERECTOMY     BACK SURGERY     CESAREAN SECTION      Family History  Problem Relation Age of Onset   Diabetes Mother    Heart disease Mother    Diabetes Father    Breast cancer Cousin     Social History   Tobacco Use   Smoking status: Former    Types: Cigarettes    Quit date: 05/16/2001    Years since quitting: 21.4   Smokeless tobacco: Never  Vaping Use   Vaping Use: Never used  Substance Use Topics   Alcohol use: Not Currently   Drug use: No    ROS   Objective:   Vitals: BP (!) 142/73 (BP Location: Right  Arm)   Pulse 73   Temp 98.9 F (37.2 C) (Oral)   Resp 20   SpO2 94%   Physical Exam Constitutional:      General: She is not in acute distress.    Appearance: Normal appearance. She is well-developed. She is not ill-appearing, toxic-appearing or diaphoretic.  HENT:     Head: Normocephalic and atraumatic.     Nose: Nose normal.     Mouth/Throat:     Mouth: Mucous membranes are moist.  Eyes:     General: No scleral icterus.       Right eye: No discharge.        Left eye: No discharge.     Extraocular Movements: Extraocular movements intact.  Neck:   Cardiovascular:     Rate and Rhythm: Normal rate.  Pulmonary:     Effort: Pulmonary effort is normal.  Skin:    General: Skin is warm and dry.  Neurological:     General: No focal deficit present.     Mental Status:  She is alert and oriented to person, place, and time.  Psychiatric:        Mood and Affect: Mood normal.        Behavior: Behavior normal.     Assessment and Plan :   PDMP not reviewed this encounter.  1. Atopic dermatitis, unspecified type   2. Type 2 diabetes mellitus treated with insulin (HCC)    Recommend topical steroids, good skin care. Counseled patient on potential for adverse effects with medications prescribed/recommended today, ER and return-to-clinic precautions discussed, patient verbalized understanding.    Wallis Bamberg, [place] 10/30/22 1547

## 2022-10-30 NOTE — ED Triage Notes (Signed)
Pt c/o rash to back of head x 3 days-NAD-steady gait

## 2022-10-30 NOTE — Discharge Instructions (Addendum)
Apply thin layers twice daily for 1 week to the skin. Hold off on using this after that course for 1 week total. You can use again as needed but must keep 1 week on, 1 week off.

## 2022-12-06 NOTE — Progress Notes (Signed)
Noted per epic patient has had follow up labs with [facility]

## 2023-01-04 ENCOUNTER — Ambulatory Visit: Payer: PPO | Admitting: Podiatry

## 2023-01-11 DIAGNOSIS — L304 Erythema intertrigo: Secondary | ICD-10-CM | POA: Diagnosis not present

## 2023-01-11 DIAGNOSIS — L308 Other specified dermatitis: Secondary | ICD-10-CM | POA: Diagnosis not present

## 2023-01-11 DIAGNOSIS — E113593 Type 2 diabetes mellitus with proliferative diabetic retinopathy without macular edema, bilateral: Secondary | ICD-10-CM | POA: Diagnosis not present

## 2023-01-11 DIAGNOSIS — N1831 Chronic kidney disease, stage 3a: Secondary | ICD-10-CM | POA: Diagnosis not present

## 2023-01-11 DIAGNOSIS — Z794 Long term (current) use of insulin: Secondary | ICD-10-CM | POA: Diagnosis not present

## 2023-01-11 DIAGNOSIS — E1122 Type 2 diabetes mellitus with diabetic chronic kidney disease: Secondary | ICD-10-CM | POA: Diagnosis not present

## 2023-01-20 ENCOUNTER — Encounter: Payer: Self-pay | Admitting: Podiatry

## 2023-01-20 ENCOUNTER — Ambulatory Visit (INDEPENDENT_AMBULATORY_CARE_PROVIDER_SITE_OTHER): Payer: PPO | Admitting: Podiatry

## 2023-01-20 DIAGNOSIS — B351 Tinea unguium: Secondary | ICD-10-CM

## 2023-01-20 DIAGNOSIS — M79675 Pain in left toe(s): Secondary | ICD-10-CM

## 2023-01-20 DIAGNOSIS — M79674 Pain in right toe(s): Secondary | ICD-10-CM | POA: Diagnosis not present

## 2023-01-20 DIAGNOSIS — E1165 Type 2 diabetes mellitus with hyperglycemia: Secondary | ICD-10-CM | POA: Diagnosis not present

## 2023-01-20 DIAGNOSIS — I739 Peripheral vascular disease, unspecified: Secondary | ICD-10-CM

## 2023-01-20 NOTE — Progress Notes (Signed)
  Subjective:  Patient ID: Kathy Robles, female    DOB: 1946-05-29,   MRN: [id number]  Chief Complaint  Patient presents with   Nail Problem    [facility]    77 y.o. female presents for concern of thickened elongated and painful nails that are difficult to trim. Requesting to have them trimmed today. Relates burning and tingling in their feet. Patient is diabetic and last A1c was  Lab Results  Component Value Date   HGBA1C 10.2 (H) 01/17/2021   .   PCP:  Mila Palmer, MD    . Denies any other pedal complaints. Denies n/v/f/c.   Past Medical History:  Diagnosis Date   Cataract    Coronary artery disease    Diabetes mellitus    Hyperlipemia    Hypertension    Migraine    Neuropathy    Peritonitis (HCC)    Pulmonary embolus (HCC)    Rheumatoid arthritis (HCC)     Objective:  Physical Exam: Vascular: DP/PT pulses 2/4 bilateral. CFT <3 seconds. Absent hair growth on digits. Edema noted to bilateral lower extremities. Xerosis noted bilaterally.  Skin. No lacerations or abrasions bilateral feet. Nails 1-5 bilateral  are thickened discolored and elongated with subungual debris.  Musculoskeletal: MMT 5/5 bilateral lower extremities in DF, PF, Inversion and Eversion. Deceased ROM in DF of ankle joint.  Neurological: Sensation intact to light touch. Protective sensation diminished bilateral.    Assessment:   1. Pain due to onychomycosis of toenails of both feet   2. Peripheral vascular disease, unspecified (HCC)   3. Type 2 diabetes mellitus with hyperglycemia, unspecified whether long term insulin use (HCC)       Plan:  Patient was evaluated and treated and all questions answered. -Discussed and educated patient on diabetic foot care, especially with  regards to the vascular, neurological and musculoskeletal systems.  -Stressed the importance of good glycemic control and the detriment of not  controlling glucose levels in relation to the foot. -Discussed supportive shoes at  all times and checking feet regularly.  -Mechanically debrided all nails 1-5 bilateral using sterile nail nipper and filed with dremel without incident  -Answered all patient questions -Patient to return  in 3 months for at risk foot care -Patient advised to call the office if any problems or questions arise in the meantime.   Louann Sjogren, DPM

## 2023-01-21 ENCOUNTER — Other Ambulatory Visit (HOSPITAL_COMMUNITY): Payer: Self-pay

## 2023-01-21 DIAGNOSIS — E039 Hypothyroidism, unspecified: Secondary | ICD-10-CM | POA: Diagnosis not present

## 2023-01-21 DIAGNOSIS — E1165 Type 2 diabetes mellitus with hyperglycemia: Secondary | ICD-10-CM | POA: Diagnosis not present

## 2023-01-21 DIAGNOSIS — I10 Essential (primary) hypertension: Secondary | ICD-10-CM | POA: Diagnosis not present

## 2023-01-21 DIAGNOSIS — E1140 Type 2 diabetes mellitus with diabetic neuropathy, unspecified: Secondary | ICD-10-CM | POA: Diagnosis not present

## 2023-01-21 DIAGNOSIS — E7800 Pure hypercholesterolemia, unspecified: Secondary | ICD-10-CM | POA: Diagnosis not present

## 2023-01-21 DIAGNOSIS — E11319 Type 2 diabetes mellitus with unspecified diabetic retinopathy without macular edema: Secondary | ICD-10-CM | POA: Diagnosis not present

## 2023-01-21 MED ORDER — JARDIANCE 10 MG PO TABS
10.0000 mg | ORAL_TABLET | Freq: Every day | ORAL | 1 refills | Status: AC
  Filled 2023-01-21: qty 90, 90d supply, fill #0

## 2023-02-01 ENCOUNTER — Other Ambulatory Visit (HOSPITAL_COMMUNITY): Payer: Self-pay

## 2023-03-12 ENCOUNTER — Other Ambulatory Visit: Payer: Self-pay

## 2023-03-12 ENCOUNTER — Emergency Department (HOSPITAL_BASED_OUTPATIENT_CLINIC_OR_DEPARTMENT_OTHER)
Admission: EM | Admit: 2023-03-12 | Discharge: 2023-03-12 | Disposition: A | Discharge status: Home / Self Care | Payer: PPO | Attending: Emergency Medicine | Admitting: Emergency Medicine

## 2023-03-12 ENCOUNTER — Emergency Department (HOSPITAL_BASED_OUTPATIENT_CLINIC_OR_DEPARTMENT_OTHER): Payer: PPO

## 2023-03-12 ENCOUNTER — Encounter (HOSPITAL_BASED_OUTPATIENT_CLINIC_OR_DEPARTMENT_OTHER): Payer: Self-pay | Admitting: Emergency Medicine

## 2023-03-12 DIAGNOSIS — I10 Essential (primary) hypertension: Secondary | ICD-10-CM | POA: Insufficient documentation

## 2023-03-12 DIAGNOSIS — R059 Cough, unspecified: Secondary | ICD-10-CM | POA: Diagnosis not present

## 2023-03-12 DIAGNOSIS — I700 Atherosclerosis of aorta: Secondary | ICD-10-CM | POA: Diagnosis not present

## 2023-03-12 DIAGNOSIS — R051 Acute cough: Secondary | ICD-10-CM | POA: Diagnosis not present

## 2023-03-12 DIAGNOSIS — Z7982 Long term (current) use of aspirin: Secondary | ICD-10-CM | POA: Diagnosis not present

## 2023-03-12 DIAGNOSIS — R600 Localized edema: Secondary | ICD-10-CM | POA: Diagnosis not present

## 2023-03-12 DIAGNOSIS — M7989 Other specified soft tissue disorders: Secondary | ICD-10-CM | POA: Diagnosis not present

## 2023-03-12 DIAGNOSIS — Z794 Long term (current) use of insulin: Secondary | ICD-10-CM | POA: Diagnosis not present

## 2023-03-12 DIAGNOSIS — Z1152 Encounter for screening for COVID-19: Secondary | ICD-10-CM | POA: Diagnosis not present

## 2023-03-12 DIAGNOSIS — M79604 Pain in right leg: Secondary | ICD-10-CM | POA: Diagnosis not present

## 2023-03-12 DIAGNOSIS — Z79899 Other long term (current) drug therapy: Secondary | ICD-10-CM | POA: Diagnosis not present

## 2023-03-12 DIAGNOSIS — E119 Type 2 diabetes mellitus without complications: Secondary | ICD-10-CM | POA: Insufficient documentation

## 2023-03-12 DIAGNOSIS — L03115 Cellulitis of right lower limb: Secondary | ICD-10-CM | POA: Diagnosis not present

## 2023-03-12 DIAGNOSIS — B349 Viral infection, unspecified: Secondary | ICD-10-CM | POA: Diagnosis not present

## 2023-03-12 DIAGNOSIS — I2510 Atherosclerotic heart disease of native coronary artery without angina pectoris: Secondary | ICD-10-CM | POA: Diagnosis not present

## 2023-03-12 DIAGNOSIS — J9811 Atelectasis: Secondary | ICD-10-CM | POA: Diagnosis not present

## 2023-03-12 LAB — BASIC METABOLIC PANEL
Anion gap: 7 (ref 5–15)
BUN: 17 mg/dL (ref 8–23)
CO2: 25 mmol/L (ref 22–32)
Calcium: 9.2 mg/dL (ref 8.9–10.3)
Chloride: 110 mmol/L (ref 98–111)
Creatinine, Ser: 1.05 mg/dL — ABNORMAL HIGH (ref 0.44–1.00)
GFR, Estimated: 55 mL/min — ABNORMAL LOW (ref >60)
Glucose, Bld: 83 mg/dL (ref 70–99)
Potassium: 3.9 mmol/L (ref 3.5–5.1)
Sodium: 142 mmol/L (ref 135–145)

## 2023-03-12 LAB — RESP PANEL BY RT-PCR (RSV, FLU A&B, COVID)  RVPGX2
Influenza A by PCR: NEGATIVE
Influenza B by PCR: NEGATIVE
Resp Syncytial Virus by PCR: NEGATIVE
SARS Coronavirus 2 by RT PCR: NEGATIVE

## 2023-03-12 LAB — TROPONIN I (HIGH SENSITIVITY): Troponin I (High Sensitivity): 5 ng/L (ref <18)

## 2023-03-12 LAB — CBC
HCT: 38.2 % (ref 36.0–46.0)
Hemoglobin: 12.9 g/dL (ref 12.0–15.0)
MCH: 29.2 pg (ref 26.0–34.0)
MCHC: 33.8 g/dL (ref 30.0–36.0)
MCV: 86.4 fL (ref 80.0–100.0)
Platelets: 193 10*3/uL (ref 150–400)
RBC: 4.42 MIL/uL (ref 3.87–5.11)
RDW: 13 % (ref 11.5–15.5)
WBC: 6.1 10*3/uL (ref 4.0–10.5)
nRBC: 0 % (ref 0.0–0.2)

## 2023-03-12 LAB — BRAIN NATRIURETIC PEPTIDE: B Natriuretic Peptide: 286.3 pg/mL — ABNORMAL HIGH (ref 0.0–100.0)

## 2023-03-12 MED ORDER — HYDRALAZINE HCL 25 MG PO TABS
25.0000 mg | ORAL_TABLET | Freq: Once | ORAL | Status: AC
  Administered 2023-03-12: 25 mg via ORAL
  Filled 2023-03-12: qty 1

## 2023-03-12 MED ORDER — HYDROCHLOROTHIAZIDE 12.5 MG PO TABS: 12.5000 mg | ORAL_TABLET | Freq: Every day | ORAL | 2 refills | Status: AC

## 2023-03-12 MED ORDER — IOHEXOL 350 MG/ML SOLN
100.0000 mL | Freq: Once | INTRAVENOUS | Status: AC | PRN
  Administered 2023-03-12: 75 mL via INTRAVENOUS

## 2023-03-12 MED ORDER — CEPHALEXIN 500 MG PO CAPS: 500.0000 mg | ORAL_CAPSULE | Freq: Two times a day (BID) | ORAL | 0 refills | Status: AC

## 2023-03-12 NOTE — ED Provider Notes (Signed)
[place] EMERGENCY DEPARTMENT AT [facility] Provider Note   CSN: [id number] Arrival date & time: 03/12/23  1539     History {Add pertinent medical, surgical, social history, OB history to HPI:1} Chief Complaint  Patient presents with   Covid Exposure   Leg Pain    Kathy Robles is a 77 y.o. female.  77 year old female with a history of PE not currently on anticoagulation (provoked by C-section), hypertension, hyperlipidemia, and diabetes who presents to the emergency department with right lower extremity swelling and pain and cough for the past 3 days.  Reports that over the past 3 days has had progressive swelling of her right lower extremity.  Small amount of redness as well.  Has had cough but no chest pain or shortness of breath.  No fevers.  Also did have some diarrhea recently and was concerned that she might have COVID.  Says that her daughter-in-law is having similar symptoms now as well.       Home Medications Prior to Admission medications   Medication Sig Start Date End Date Taking? Authorizing Provider  Albuterol Sulfate 108 (90 Base) MCG/ACT AEPB Inhale 1-2 puffs into the lungs every 4 (four) hours as needed. 08/25/19   Betancourt, Jarold Song, NP  aspirin EC 81 MG tablet Take 81 mg by mouth daily.    [provider]  b complex vitamins tablet Take 1 tablet by mouth daily.    [provider]  calcium carbonate (OSCAL) 1500 (600 Ca) MG TABS tablet Take 600 mg of elemental calcium by mouth daily with breakfast.    [provider]  cholecalciferol (VITAMIN D) 1000 units tablet Take 1,000 Units by mouth daily.    [provider]  cloNIDine (CATAPRES) 0.2 MG tablet Take 1 tablet by mouth daily.    [provider]  diclofenac sodium (VOLTAREN) 1 % GEL Apply 4 g topically 4 (four) times daily as needed.    [provider]  diphenhydramine-acetaminophen (TYLENOL PM) 25-500 MG TABS tablet Take 1 tablet by mouth at  bedtime as needed.    [provider]  empagliflozin (JARDIANCE) 10 MG TABS tablet Take 1 tablet (10 mg total) by mouth daily. 01/21/23     HUMALOG MIX 75/25 KWIKPEN (75-25) 100 UNIT/ML Kwikpen 40 units am; 50 units pm SQ 07/17/19   [provider]  hydrochlorothiazide (HYDRODIURIL) 12.5 MG tablet Take 1 tablet by mouth daily.    [provider]  levothyroxine (SYNTHROID, LEVOTHROID) 25 MCG tablet  06/04/17   [provider]  pregabalin (LYRICA) 25 MG capsule Take 25 mg by mouth daily. 12/20/20   [provider]  simvastatin (ZOCOR) 40 MG tablet Take 40 mg by mouth every evening.    [provider]  triamcinolone cream (KENALOG) 0.1 % Apply 1 Application topically 2 (two) times daily. 10/30/22   Wallis Bamberg, PA-C      Allergies    Coumadin [warfarin sodium], Insulin lispro, Lisinopril, Ramipril, and Warfarin    Review of Systems   Review of Systems  Physical Exam Updated Vital Signs BP (!) 183/63 (BP Location: Right Arm)   Pulse 70   Temp 98.8 F (37.1 C)   Resp 17   SpO2 98%  Physical Exam Vitals and nursing note reviewed.  Constitutional:      General: She is not in acute distress.    Appearance: She is well-developed.  HENT:     Head: Normocephalic and atraumatic.     Right Ear: External ear  normal.     Left Ear: External ear normal.     Nose: Nose normal.  Eyes:     Extraocular Movements: Extraocular movements intact.     Conjunctiva/sclera: Conjunctivae normal.     Pupils: Pupils are equal, round, and reactive to light.  Cardiovascular:     Rate and Rhythm: Normal rate and regular rhythm.     Heart sounds: No murmur heard. Pulmonary:     Effort: Pulmonary effort is normal. No respiratory distress.     Breath sounds: Normal breath sounds.  Abdominal:     General: Abdomen is flat. There is no distension.     Palpations: Abdomen is soft. There is no mass.     Tenderness: There is no abdominal tenderness. There is no  guarding.  Musculoskeletal:     Cervical back: Normal range of motion and neck supple.     Right lower leg: Edema present.     Left lower leg: No edema.  Skin:    General: Skin is warm and dry.  Neurological:     Mental Status: She is alert and oriented to person, place, and time. Mental status is at baseline.  Psychiatric:        Mood and Affect: Mood normal.     ED Results / Procedures / Treatments   Labs (all labs ordered are listed, but only abnormal results are displayed) Labs Reviewed  BASIC METABOLIC PANEL - Abnormal; Notable for the following components:      Result Value   Creatinine, Ser 1.05 (*)    GFR, Estimated 55 (*)    All other components within normal limits  RESP PANEL BY RT-PCR (RSV, FLU A&B, COVID)  RVPGX2  CBC  BRAIN NATRIURETIC PEPTIDE  TROPONIN I (HIGH SENSITIVITY)    EKG None  Radiology No results found.  Procedures Procedures  {Document cardiac monitor, telemetry assessment procedure when appropriate:1}  Medications Ordered in ED Medications - No data to display  ED Course/ Medical Decision Making/ A&P   {   Click here for ABCD2, HEART and other calculatorsREFRESH Note before signing :1}                              Medical Decision Making Amount and/or Complexity of Data Reviewed Labs: ordered. Radiology: ordered.   ***  {Document critical care time when appropriate:1} {Document review of labs and clinical decision tools ie heart score, Chads2Vasc2 etc:1}  {Document your independent review of radiology images, and any outside records:1} {Document your discussion with family members, caretakers, and with consultants:1} {Document social determinants of health affecting pt's care:1} {Document your decision making why or why not admission, treatments were needed:1} Final Clinical Impression(s) / ED Diagnoses Final diagnoses:  None    Rx / DC Orders ED Discharge Orders     None

## 2023-03-12 NOTE — Discharge Instructions (Signed)
You were seen for your leg swelling from cellulitis in the emergency department.   At home, please take the Keflex (antibiotic) for your cellulitis.  Please also start taking the hydrochlorothiazide for your blood pressure.    Check your MyChart online for the results of any tests that had not resulted by the time you left the emergency department.   Follow-up with your primary doctor in 2-3 days regarding your visit.    Return immediately to the emergency department if you experience any of the following: Chest pain, shortness of breath, fevers, or any other concerning symptoms.    Thank you for visiting our Emergency Department. It was a pleasure taking care of you today.

## 2023-03-12 NOTE — ED Triage Notes (Signed)
Pt reports right leg pain with swelling to calf that began 3 days ago.  Pt also reports diabetic neuropathy and attributed it to that.  Pt also has a daughter in law who was diagnosed with covid today and she has similar symptoms with diarrhea and cough. No fever.

## 2023-03-24 DIAGNOSIS — E039 Hypothyroidism, unspecified: Secondary | ICD-10-CM | POA: Diagnosis not present

## 2023-03-24 DIAGNOSIS — E1165 Type 2 diabetes mellitus with hyperglycemia: Secondary | ICD-10-CM | POA: Diagnosis not present

## 2023-03-25 DIAGNOSIS — L03115 Cellulitis of right lower limb: Secondary | ICD-10-CM | POA: Diagnosis not present

## 2023-03-25 DIAGNOSIS — R600 Localized edema: Secondary | ICD-10-CM | POA: Diagnosis not present

## 2023-03-30 DIAGNOSIS — L03115 Cellulitis of right lower limb: Secondary | ICD-10-CM | POA: Diagnosis not present

## 2023-03-30 DIAGNOSIS — E119 Type 2 diabetes mellitus without complications: Secondary | ICD-10-CM | POA: Diagnosis not present

## 2023-03-30 DIAGNOSIS — N289 Disorder of kidney and ureter, unspecified: Secondary | ICD-10-CM | POA: Diagnosis not present

## 2023-03-30 DIAGNOSIS — R600 Localized edema: Secondary | ICD-10-CM | POA: Diagnosis not present

## 2023-04-06 DIAGNOSIS — R600 Localized edema: Secondary | ICD-10-CM | POA: Diagnosis not present

## 2023-04-06 DIAGNOSIS — L0390 Cellulitis, unspecified: Secondary | ICD-10-CM | POA: Diagnosis not present

## 2023-04-06 DIAGNOSIS — N289 Disorder of kidney and ureter, unspecified: Secondary | ICD-10-CM | POA: Diagnosis not present

## 2023-04-08 DIAGNOSIS — E1165 Type 2 diabetes mellitus with hyperglycemia: Secondary | ICD-10-CM | POA: Diagnosis not present

## 2023-04-08 DIAGNOSIS — I10 Essential (primary) hypertension: Secondary | ICD-10-CM | POA: Diagnosis not present

## 2023-04-08 DIAGNOSIS — N179 Acute kidney failure, unspecified: Secondary | ICD-10-CM | POA: Diagnosis not present

## 2023-04-08 DIAGNOSIS — E039 Hypothyroidism, unspecified: Secondary | ICD-10-CM | POA: Diagnosis not present

## 2023-04-20 DIAGNOSIS — L0390 Cellulitis, unspecified: Secondary | ICD-10-CM | POA: Diagnosis not present

## 2023-04-20 DIAGNOSIS — R600 Localized edema: Secondary | ICD-10-CM | POA: Diagnosis not present

## 2023-04-20 DIAGNOSIS — E1122 Type 2 diabetes mellitus with diabetic chronic kidney disease: Secondary | ICD-10-CM | POA: Diagnosis not present

## 2023-04-20 DIAGNOSIS — N289 Disorder of kidney and ureter, unspecified: Secondary | ICD-10-CM | POA: Diagnosis not present

## 2023-04-26 ENCOUNTER — Ambulatory Visit: Payer: PPO | Admitting: Podiatry

## 2023-04-27 ENCOUNTER — Ambulatory Visit: Payer: PPO | Admitting: Podiatry

## 2023-05-13 DIAGNOSIS — I10 Essential (primary) hypertension: Secondary | ICD-10-CM | POA: Diagnosis not present

## 2023-05-13 DIAGNOSIS — E1140 Type 2 diabetes mellitus with diabetic neuropathy, unspecified: Secondary | ICD-10-CM | POA: Diagnosis not present

## 2023-05-13 DIAGNOSIS — E1165 Type 2 diabetes mellitus with hyperglycemia: Secondary | ICD-10-CM | POA: Diagnosis not present

## 2023-05-13 DIAGNOSIS — E039 Hypothyroidism, unspecified: Secondary | ICD-10-CM | POA: Diagnosis not present

## 2023-05-13 DIAGNOSIS — E7800 Pure hypercholesterolemia, unspecified: Secondary | ICD-10-CM | POA: Diagnosis not present

## 2023-06-18 DIAGNOSIS — E1165 Type 2 diabetes mellitus with hyperglycemia: Secondary | ICD-10-CM | POA: Diagnosis not present

## 2023-07-21 ENCOUNTER — Ambulatory Visit: Payer: PPO | Admitting: Podiatry

## 2023-07-21 ENCOUNTER — Encounter: Payer: Self-pay | Admitting: Podiatry

## 2023-07-21 DIAGNOSIS — I739 Peripheral vascular disease, unspecified: Secondary | ICD-10-CM | POA: Diagnosis not present

## 2023-07-21 DIAGNOSIS — B351 Tinea unguium: Secondary | ICD-10-CM

## 2023-07-21 DIAGNOSIS — E1165 Type 2 diabetes mellitus with hyperglycemia: Secondary | ICD-10-CM

## 2023-07-21 DIAGNOSIS — M79674 Pain in right toe(s): Secondary | ICD-10-CM

## 2023-07-21 DIAGNOSIS — M79675 Pain in left toe(s): Secondary | ICD-10-CM | POA: Diagnosis not present

## 2023-07-21 NOTE — Progress Notes (Signed)
This patient presents to the office with chief complaint of long thick nails .  This patient says there are long thick painful nails.  These nails are painful walking and wearing shoes.   Patient is unable to  self treat his own nails . This patient presents  to the office today for treatment of the  long nails and a foot evaluation   General Appearance  Alert, conversant and in no acute stress.  Vascular  Dorsalis pedis and posterior tibial  pulses are palpable  bilaterally.  Capillary return is within normal limits  bilaterally. Temperature is within normal limits  bilaterally.  Neurologic  Senn-Weinstein monofilament wire test within normal limits  bilaterally. Muscle power within normal limits bilaterally.  Nails Thick disfigured discolored nails with subungual debris  from hallux to fifth toes bilaterally. No evidence of bacterial infection or drainage bilaterally.  HAV  B/L.  Orthopedic  No limitations of motion of motion feet .  No crepitus or effusions noted.  No bony pathology or digital deformities noted.  Skin  normotropic skin with no porokeratosis noted bilaterally.  No signs of infections or ulcers noted.     Onychomycosis  Diabetes with no foot complications  Debride nails x 10.  With nail nipper and dremel tool.  RTC 3 months.   Helane Gunther DPM

## 2023-07-29 ENCOUNTER — Other Ambulatory Visit: Payer: Self-pay | Admitting: Nephrology

## 2023-07-29 DIAGNOSIS — I2699 Other pulmonary embolism without acute cor pulmonale: Secondary | ICD-10-CM | POA: Diagnosis not present

## 2023-07-29 DIAGNOSIS — N184 Chronic kidney disease, stage 4 (severe): Secondary | ICD-10-CM | POA: Diagnosis not present

## 2023-07-29 DIAGNOSIS — E7800 Pure hypercholesterolemia, unspecified: Secondary | ICD-10-CM | POA: Diagnosis not present

## 2023-07-29 DIAGNOSIS — I739 Peripheral vascular disease, unspecified: Secondary | ICD-10-CM | POA: Diagnosis not present

## 2023-07-29 DIAGNOSIS — E1122 Type 2 diabetes mellitus with diabetic chronic kidney disease: Secondary | ICD-10-CM | POA: Diagnosis not present

## 2023-07-29 DIAGNOSIS — I129 Hypertensive chronic kidney disease with stage 1 through stage 4 chronic kidney disease, or unspecified chronic kidney disease: Secondary | ICD-10-CM | POA: Diagnosis not present

## 2023-07-29 DIAGNOSIS — N179 Acute kidney failure, unspecified: Secondary | ICD-10-CM | POA: Diagnosis not present

## 2023-08-17 ENCOUNTER — Other Ambulatory Visit: Payer: PPO

## 2023-08-17 ENCOUNTER — Ambulatory Visit
Admission: RE | Admit: 2023-08-17 | Discharge: 2023-08-17 | Disposition: A | Discharge status: Home / Self Care | Payer: PPO | Source: Ambulatory Visit | Attending: Nephrology | Admitting: Nephrology

## 2023-08-17 DIAGNOSIS — N184 Chronic kidney disease, stage 4 (severe): Secondary | ICD-10-CM

## 2023-09-10 DIAGNOSIS — M0609 Rheumatoid arthritis without rheumatoid factor, multiple sites: Secondary | ICD-10-CM | POA: Diagnosis not present

## 2023-09-10 DIAGNOSIS — E1165 Type 2 diabetes mellitus with hyperglycemia: Secondary | ICD-10-CM | POA: Diagnosis not present

## 2023-09-10 DIAGNOSIS — E1140 Type 2 diabetes mellitus with diabetic neuropathy, unspecified: Secondary | ICD-10-CM | POA: Diagnosis not present

## 2023-09-10 DIAGNOSIS — E7800 Pure hypercholesterolemia, unspecified: Secondary | ICD-10-CM | POA: Diagnosis not present

## 2023-09-10 DIAGNOSIS — I872 Venous insufficiency (chronic) (peripheral): Secondary | ICD-10-CM | POA: Diagnosis not present

## 2023-09-10 DIAGNOSIS — Z79899 Other long term (current) drug therapy: Secondary | ICD-10-CM | POA: Diagnosis not present

## 2023-09-10 DIAGNOSIS — E039 Hypothyroidism, unspecified: Secondary | ICD-10-CM | POA: Diagnosis not present

## 2023-09-10 DIAGNOSIS — Z0000 Encounter for general adult medical examination without abnormal findings: Secondary | ICD-10-CM | POA: Diagnosis not present

## 2023-09-10 DIAGNOSIS — N1831 Chronic kidney disease, stage 3a: Secondary | ICD-10-CM | POA: Diagnosis not present

## 2023-09-22 DIAGNOSIS — Z79899 Other long term (current) drug therapy: Secondary | ICD-10-CM | POA: Diagnosis not present

## 2023-09-22 DIAGNOSIS — E039 Hypothyroidism, unspecified: Secondary | ICD-10-CM | POA: Diagnosis not present

## 2023-09-22 DIAGNOSIS — Z0000 Encounter for general adult medical examination without abnormal findings: Secondary | ICD-10-CM | POA: Diagnosis not present

## 2023-09-22 DIAGNOSIS — E1165 Type 2 diabetes mellitus with hyperglycemia: Secondary | ICD-10-CM | POA: Diagnosis not present

## 2023-09-22 DIAGNOSIS — E7800 Pure hypercholesterolemia, unspecified: Secondary | ICD-10-CM | POA: Diagnosis not present

## 2023-09-28 DIAGNOSIS — R600 Localized edema: Secondary | ICD-10-CM | POA: Diagnosis not present

## 2023-09-29 DIAGNOSIS — I10 Essential (primary) hypertension: Secondary | ICD-10-CM | POA: Diagnosis not present

## 2023-09-29 DIAGNOSIS — E039 Hypothyroidism, unspecified: Secondary | ICD-10-CM | POA: Diagnosis not present

## 2023-09-29 DIAGNOSIS — E1165 Type 2 diabetes mellitus with hyperglycemia: Secondary | ICD-10-CM | POA: Diagnosis not present

## 2023-09-29 DIAGNOSIS — E7800 Pure hypercholesterolemia, unspecified: Secondary | ICD-10-CM | POA: Diagnosis not present

## 2023-10-01 ENCOUNTER — Other Ambulatory Visit: Payer: Self-pay | Admitting: Family Medicine

## 2023-10-01 DIAGNOSIS — R600 Localized edema: Secondary | ICD-10-CM

## 2023-10-07 ENCOUNTER — Other Ambulatory Visit: Payer: Self-pay | Admitting: *Deleted

## 2023-10-07 DIAGNOSIS — M7989 Other specified soft tissue disorders: Secondary | ICD-10-CM

## 2023-10-08 ENCOUNTER — Ambulatory Visit
Admission: RE | Admit: 2023-10-08 | Discharge: 2023-10-08 | Disposition: A | Discharge status: Home / Self Care | Source: Ambulatory Visit | Attending: Family Medicine | Admitting: Family Medicine

## 2023-10-08 DIAGNOSIS — R2241 Localized swelling, mass and lump, right lower limb: Secondary | ICD-10-CM | POA: Diagnosis not present

## 2023-10-08 DIAGNOSIS — R600 Localized edema: Secondary | ICD-10-CM | POA: Diagnosis not present

## 2023-10-21 ENCOUNTER — Ambulatory Visit (HOSPITAL_COMMUNITY)
Admission: RE | Admit: 2023-10-21 | Discharge: 2023-10-21 | Disposition: A | Discharge status: Home / Self Care | Source: Ambulatory Visit | Attending: Vascular Surgery | Admitting: Vascular Surgery

## 2023-10-21 DIAGNOSIS — M7989 Other specified soft tissue disorders: Secondary | ICD-10-CM | POA: Insufficient documentation

## 2023-11-02 ENCOUNTER — Ambulatory Visit: Attending: Vascular Surgery | Admitting: Physician Assistant

## 2023-11-02 VITALS — BP 178/76 | HR 58 | Temp 98.1°F | Wt 161.1 lb

## 2023-11-02 DIAGNOSIS — I872 Venous insufficiency (chronic) (peripheral): Secondary | ICD-10-CM

## 2023-11-09 NOTE — Progress Notes (Unsigned)
 Requested by:  Olin Bertin, MD [address] [place],  [place] [address]  Reason for consultation: venous insufficiency    History of Present Illness   Kathy Robles is a 78 y.o. ([birth year]) female who presents for evaluation of venous insufficiency.  Past Medical History:  Diagnosis Date   Cataract    Coronary artery disease    Diabetes mellitus    Hyperlipemia    Hypertension    Migraine    Neuropathy    Peritonitis (HCC)    Pulmonary embolus (HCC)    Rheumatoid arthritis (HCC)     Past Surgical History:  Procedure Laterality Date   ABDOMINAL EXPLORATION SURGERY     ABDOMINAL HYSTERECTOMY     BACK SURGERY     CESAREAN SECTION      Social History   Socioeconomic History   Marital status: Divorced    Spouse name: Not on file   Number of children: Not on file   Years of education: Not on file   Highest education level: Not on file  Occupational History   Not on file  Tobacco Use   Smoking status: Former    Current packs/day: 0.00    Types: Cigarettes    Quit date: 05/16/2001    Years since quitting: 22.4   Smokeless tobacco: Never  Vaping Use   Vaping status: Never Used  Substance and Sexual Activity   Alcohol use: Not Currently   Drug use: No   Sexual activity: Not on file  Other Topics Concern   Not on file  Social History Narrative   Not on file   Social Drivers of Health   Financial Resource Strain: Not on file  Food Insecurity: Not on file  Transportation Needs: Not on file  Physical Activity: Not on file  Stress: Not on file  Social Connections: Not on file  Intimate Partner Violence: Not on file    Family History  Problem Relation Age of Onset   Diabetes Mother    Heart disease Mother    Diabetes Father    Breast cancer Cousin     Current Outpatient Medications  Medication Sig Dispense Refill   Albuterol  Sulfate 108 (90 Base) MCG/ACT AEPB Inhale 1-2 puffs into the lungs every 4 (four) hours as needed. 1 each 1    aspirin EC 81 MG tablet Take 81 mg by mouth daily.     b complex vitamins tablet Take 1 tablet by mouth daily.     calcium carbonate (OSCAL) 1500 (600 Ca) MG TABS tablet Take 600 mg of elemental calcium by mouth daily with breakfast.     cholecalciferol (VITAMIN D ) 1000 units tablet Take 1,000 Units by mouth daily.     cloNIDine (CATAPRES) 0.2 MG tablet Take 1 tablet by mouth daily.     diclofenac sodium (VOLTAREN) 1 % GEL Apply 4 g topically 4 (four) times daily as needed.     diphenhydramine-acetaminophen  (TYLENOL  PM) 25-500 MG TABS tablet Take 1 tablet by mouth at bedtime as needed.     empagliflozin  (JARDIANCE ) 10 MG TABS tablet Take 1 tablet (10 mg total) by mouth daily. 90 tablet 1   HUMALOG MIX 75/25 KWIKPEN (75-25) 100 UNIT/ML Kwikpen 40 units am; 50 units pm SQ     hydrochlorothiazide  (HYDRODIURIL ) 12.5 MG tablet Take 1 tablet (12.5 mg total) by mouth daily. 30 tablet 2   levothyroxine (SYNTHROID, LEVOTHROID) 25 MCG tablet      pregabalin (LYRICA) 25 MG capsule Take 25  mg by mouth daily.     simvastatin (ZOCOR) 40 MG tablet Take 40 mg by mouth every evening.     triamcinolone  cream (KENALOG ) 0.1 % Apply 1 Application topically 2 (two) times daily. 30 g 0   No current facility-administered medications for this visit.    Allergies  Allergen Reactions   Coumadin [Warfarin Sodium] Hives   Insulin Lispro Other (See Comments)   Lisinopril Other (See Comments)   Ramipril Cough   Warfarin Hives    REVIEW OF SYSTEMS (negative unless checked):   Cardiac:  []  Chest pain or chest pressure? []  Shortness of breath upon activity? []  Shortness of breath when lying flat? []  Irregular heart rhythm?  Vascular:  []  Pain in calf, thigh, or hip brought on by walking? []  Pain in feet at night that wakes you up from your sleep? []  Blood clot in your veins? [x]  Leg swelling?  Pulmonary:  []  Oxygen at home? []  Productive cough? []  Wheezing?  Neurologic:  []  Sudden weakness in arms or  legs? []  Sudden numbness in arms or legs? []  Sudden onset of difficult speaking or slurred speech? []  Temporary loss of vision in one eye? []  Problems with dizziness?  Gastrointestinal:  []  Blood in stool? []  Vomited blood?  Genitourinary:  []  Burning when urinating? []  Blood in urine?  Psychiatric:  []  Major depression  Hematologic:  []  Bleeding problems? []  Problems with blood clotting?  Dermatologic:  []  Rashes or ulcers?  Constitutional:  []  Fever or chills?  Ear/Nose/Throat:  []  Change in hearing? []  Nose bleeds? []  Sore throat?  Musculoskeletal:  []  Back pain? []  Joint pain? []  Muscle pain?   Physical Examination     Vitals:   11/02/23 1317 11/02/23 1320  BP: (!) 190/80 (!) 178/76  Pulse: (!) 58   Temp: 98.1 F (36.7 C)   TempSrc: Temporal   SpO2: 93%   Weight: 161 lb 1.6 oz (73.1 kg)    Body mass index is 32.54 kg/m.  General:  WDWN in NAD; vital signs documented above Gait: Not observed HENT: WNL, normocephalic Pulmonary: normal non-labored breathing , without Rales, rhonchi,  wheezing Cardiac: regular Abdomen: soft, NT, no masses Skin: without rashes Vascular Exam/Pulses: palpable DP pulses bilaterally Extremities: RLE without varicose veins, with reticular veins, with mild lower leg edema, without stasis pigmentation, without lipodermatosclerosis, without ulcers Musculoskeletal: no muscle wasting or atrophy  Neurologic: A&O X 3;  No focal weakness or paresthesias are detected Psychiatric:  The pt has Normal affect.  Non-invasive Vascular Imaging   RLE Venous Insufficiency Duplex (10/25/2023):  -----------+  RIGHT        Reflux NoRefluxReflux TimeDiameter cms                          Yes                           +--------------+---------+------+-----------+------------+  CFV                    yes   >1 second               +--------------+---------+------+-----------+------------+  GSV at SFJ              yes     >500 ms      0.71      +--------------+---------+------+-----------+------------+  GSV prox thighno  0.89      +--------------+---------+------+-----------+------------+  GSV mid thigh           yes    >500 ms      0.45      +--------------+---------+------+-----------+------------+  GSV dist thighno                            0.39      +--------------+---------+------+-----------+------------+  GSV at knee             yes    >500 ms      0.37      +--------------+---------+------+-----------+------------+  GSV prox calf           yes    >500 ms      0.31      +--------------+---------+------+-----------+------------+  GSV mid calf            yes    >500 ms      0.28      +--------------+---------+------+-----------+------------+  GSV dist calf                  >500 ms      0.25      +--------------+---------+------+-----------+------------+  SSV Pop Fossa no                            0.38      +--------------+---------+------+-----------+------------+  SSV prox calf           yes    >500 ms      0.45      +--------------+---------+------+-----------+------------+  SSV mid calf            yes    >500 ms      0.24      +--------------+---------+------+-----------+------------+   Medical Decision Making   Kathy Robles is a 78 y.o. female who presents for evaluation of venous insufficiency  Based on the patient's duplex, there is reflux in the right common femoral vein and greater saphenous vein at the saphenofemoral junction, mid thigh, and knee to the distal calf.  There is also reflux in the small saphenous vein from the proximal to mid calf.  The remainder of the patient's deep and superficial venous system on the right is competent.  There is no evidence of DVT or SVT on exam at this time she could potentially be a candidate for right greater saphenous vein ablation above the thigh.   Orris Perin Lord Rod, PA-C [facility] Office: [phone]  11/09/2023, 10:33 PM  Clinic MD: [place] [facility]

## 2024-02-10 DIAGNOSIS — E1165 Type 2 diabetes mellitus with hyperglycemia: Secondary | ICD-10-CM | POA: Diagnosis not present

## 2024-03-23 DIAGNOSIS — G609 Hereditary and idiopathic neuropathy, unspecified: Secondary | ICD-10-CM | POA: Diagnosis not present

## 2024-03-23 DIAGNOSIS — E039 Hypothyroidism, unspecified: Secondary | ICD-10-CM | POA: Diagnosis not present

## 2024-03-23 DIAGNOSIS — E1165 Type 2 diabetes mellitus with hyperglycemia: Secondary | ICD-10-CM | POA: Diagnosis not present

## 2024-03-23 DIAGNOSIS — M069 Rheumatoid arthritis, unspecified: Secondary | ICD-10-CM | POA: Diagnosis not present

## 2024-03-23 DIAGNOSIS — I10 Essential (primary) hypertension: Secondary | ICD-10-CM | POA: Diagnosis not present

## 2024-03-23 DIAGNOSIS — E11319 Type 2 diabetes mellitus with unspecified diabetic retinopathy without macular edema: Secondary | ICD-10-CM | POA: Diagnosis not present

## 2024-03-23 DIAGNOSIS — E7800 Pure hypercholesterolemia, unspecified: Secondary | ICD-10-CM | POA: Diagnosis not present

## 2024-03-23 DIAGNOSIS — Z79899 Other long term (current) drug therapy: Secondary | ICD-10-CM | POA: Diagnosis not present

## 2024-03-23 DIAGNOSIS — N1831 Chronic kidney disease, stage 3a: Secondary | ICD-10-CM | POA: Diagnosis not present

## 2024-03-23 DIAGNOSIS — M1990 Unspecified osteoarthritis, unspecified site: Secondary | ICD-10-CM | POA: Diagnosis not present

## 2024-03-23 DIAGNOSIS — E1140 Type 2 diabetes mellitus with diabetic neuropathy, unspecified: Secondary | ICD-10-CM | POA: Diagnosis not present

## 2024-04-14 NOTE — Progress Notes (Signed)
 Kathy Robles                                          MRN: [id number]   04/14/2024   The [facility] Quality Team Specialist reviewed this patient medical record for the purposes of chart review for care gap closure. The following were reviewed: chart review for care gap closure-controlling blood pressure.    [facility] Quality Team

## 2024-06-12 ENCOUNTER — Ambulatory Visit: Admitting: Podiatry

## 2024-07-11 ENCOUNTER — Ambulatory Visit: Admitting: Podiatry
